# Patient Record
Sex: Female | Born: 1980 | Race: Black or African American | Hispanic: No | Marital: Single | Smoking: Current every day smoker
Health system: Southern US, Community
[De-identification: ages and names within clinical notes are randomized; demographics above are authoritative.]

## PROBLEM LIST (undated history)

## (undated) ENCOUNTER — Inpatient Hospital Stay (HOSPITAL_COMMUNITY): Payer: Self-pay

## (undated) DIAGNOSIS — F1590 Other stimulant use, unspecified, uncomplicated: Secondary | ICD-10-CM

## (undated) DIAGNOSIS — O1490 Unspecified pre-eclampsia, unspecified trimester: Secondary | ICD-10-CM

## (undated) DIAGNOSIS — F3110 Bipolar disorder, current episode manic without psychotic features, unspecified: Secondary | ICD-10-CM

## (undated) DIAGNOSIS — F1220 Cannabis dependence, uncomplicated: Secondary | ICD-10-CM

---
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
## 2001-03-19 ENCOUNTER — Emergency Department (HOSPITAL_COMMUNITY): Admission: EM | Admit: 2001-03-19 | Discharge: 2001-03-19 | Payer: Self-pay | Admitting: Emergency Medicine

## 2001-05-11 ENCOUNTER — Emergency Department (HOSPITAL_COMMUNITY): Admission: EM | Admit: 2001-05-11 | Discharge: 2001-05-11 | Payer: Self-pay | Admitting: Emergency Medicine

## 2002-11-11 ENCOUNTER — Encounter: Admission: RE | Admit: 2002-11-11 | Discharge: 2002-11-11 | Payer: Self-pay | Admitting: Internal Medicine

## 2003-08-23 ENCOUNTER — Emergency Department (HOSPITAL_COMMUNITY): Admission: EM | Admit: 2003-08-23 | Discharge: 2003-08-23 | Payer: Self-pay | Admitting: Emergency Medicine

## 2004-02-07 ENCOUNTER — Inpatient Hospital Stay (HOSPITAL_COMMUNITY): Admission: AD | Admit: 2004-02-07 | Discharge: 2004-02-07 | Payer: Self-pay | Admitting: Obstetrics and Gynecology

## 2004-02-08 ENCOUNTER — Ambulatory Visit: Payer: Self-pay | Admitting: *Deleted

## 2004-02-16 ENCOUNTER — Ambulatory Visit: Payer: Self-pay | Admitting: *Deleted

## 2004-02-16 ENCOUNTER — Ambulatory Visit (HOSPITAL_COMMUNITY): Admission: RE | Admit: 2004-02-16 | Discharge: 2004-02-16 | Payer: Self-pay | Admitting: *Deleted

## 2004-02-23 ENCOUNTER — Ambulatory Visit: Payer: Self-pay | Admitting: *Deleted

## 2004-02-23 ENCOUNTER — Inpatient Hospital Stay (HOSPITAL_COMMUNITY): Admission: AD | Admit: 2004-02-23 | Discharge: 2004-02-23 | Payer: Self-pay | Admitting: Obstetrics and Gynecology

## 2004-03-01 ENCOUNTER — Ambulatory Visit: Payer: Self-pay | Admitting: *Deleted

## 2004-03-09 ENCOUNTER — Inpatient Hospital Stay (HOSPITAL_COMMUNITY): Admission: AD | Admit: 2004-03-09 | Discharge: 2004-03-17 | Payer: Self-pay | Admitting: *Deleted

## 2004-03-09 ENCOUNTER — Ambulatory Visit: Payer: Self-pay | Admitting: Family Medicine

## 2004-03-09 ENCOUNTER — Ambulatory Visit: Payer: Self-pay | Admitting: Obstetrics & Gynecology

## 2004-03-20 ENCOUNTER — Inpatient Hospital Stay (HOSPITAL_COMMUNITY): Admission: AD | Admit: 2004-03-20 | Discharge: 2004-03-20 | Payer: Self-pay | Admitting: Obstetrics and Gynecology

## 2004-03-23 ENCOUNTER — Inpatient Hospital Stay (HOSPITAL_COMMUNITY): Admission: AD | Admit: 2004-03-23 | Discharge: 2004-03-23 | Payer: Self-pay | Admitting: Family Medicine

## 2004-03-25 ENCOUNTER — Emergency Department (HOSPITAL_COMMUNITY): Admission: EM | Admit: 2004-03-25 | Discharge: 2004-03-25 | Payer: Self-pay | Admitting: Emergency Medicine

## 2004-05-09 ENCOUNTER — Emergency Department (HOSPITAL_COMMUNITY): Admission: EM | Admit: 2004-05-09 | Discharge: 2004-05-09 | Payer: Self-pay | Admitting: Emergency Medicine

## 2004-07-21 ENCOUNTER — Emergency Department: Payer: Self-pay | Admitting: Emergency Medicine

## 2005-04-02 ENCOUNTER — Emergency Department: Payer: Self-pay | Admitting: Emergency Medicine

## 2006-01-19 ENCOUNTER — Emergency Department (HOSPITAL_COMMUNITY): Admission: EM | Admit: 2006-01-19 | Discharge: 2006-01-19 | Payer: Self-pay | Admitting: Emergency Medicine

## 2006-04-04 ENCOUNTER — Emergency Department: Payer: Self-pay | Admitting: Emergency Medicine

## 2006-05-30 ENCOUNTER — Emergency Department: Payer: Self-pay | Admitting: Unknown Physician Specialty

## 2006-09-03 ENCOUNTER — Emergency Department: Payer: Self-pay | Admitting: Emergency Medicine

## 2008-01-02 ENCOUNTER — Emergency Department: Payer: Self-pay | Admitting: Emergency Medicine

## 2008-04-09 ENCOUNTER — Emergency Department: Payer: Self-pay | Admitting: Unknown Physician Specialty

## 2008-06-25 ENCOUNTER — Emergency Department: Payer: Self-pay | Admitting: Emergency Medicine

## 2009-01-27 ENCOUNTER — Emergency Department: Payer: Self-pay | Admitting: Unknown Physician Specialty

## 2009-06-01 ENCOUNTER — Inpatient Hospital Stay: Payer: Self-pay | Admitting: Psychiatry

## 2010-02-19 ENCOUNTER — Encounter: Payer: Self-pay | Admitting: Obstetrics & Gynecology

## 2010-02-19 ENCOUNTER — Encounter: Payer: Self-pay | Admitting: *Deleted

## 2011-04-03 ENCOUNTER — Emergency Department: Payer: Self-pay | Admitting: Emergency Medicine

## 2011-04-04 ENCOUNTER — Emergency Department (HOSPITAL_COMMUNITY): Payer: Medicaid Other

## 2011-04-04 ENCOUNTER — Encounter (HOSPITAL_COMMUNITY): Payer: Self-pay | Admitting: Emergency Medicine

## 2011-04-04 ENCOUNTER — Emergency Department (HOSPITAL_COMMUNITY)
Admission: EM | Admit: 2011-04-04 | Discharge: 2011-04-04 | Disposition: A | Discharge status: Home / Self Care | Payer: Medicaid Other | Attending: Emergency Medicine | Admitting: Emergency Medicine

## 2011-04-04 DIAGNOSIS — M7989 Other specified soft tissue disorders: Secondary | ICD-10-CM | POA: Insufficient documentation

## 2011-04-04 DIAGNOSIS — M25569 Pain in unspecified knee: Secondary | ICD-10-CM | POA: Insufficient documentation

## 2011-04-04 DIAGNOSIS — F17200 Nicotine dependence, unspecified, uncomplicated: Secondary | ICD-10-CM | POA: Insufficient documentation

## 2011-04-04 DIAGNOSIS — S8991XA Unspecified injury of right lower leg, initial encounter: Secondary | ICD-10-CM

## 2011-04-04 DIAGNOSIS — S8990XA Unspecified injury of unspecified lower leg, initial encounter: Secondary | ICD-10-CM | POA: Insufficient documentation

## 2011-04-04 DIAGNOSIS — W108XXA Fall (on) (from) other stairs and steps, initial encounter: Secondary | ICD-10-CM | POA: Insufficient documentation

## 2011-04-04 DIAGNOSIS — O99891 Other specified diseases and conditions complicating pregnancy: Secondary | ICD-10-CM | POA: Insufficient documentation

## 2011-04-04 DIAGNOSIS — S99919A Unspecified injury of unspecified ankle, initial encounter: Secondary | ICD-10-CM | POA: Insufficient documentation

## 2011-04-04 HISTORY — DX: Unspecified pre-eclampsia, unspecified trimester: O14.90

## 2011-04-04 NOTE — ED Notes (Signed)
Fell down approximately 8 steps face first. States has right leg pain. Also is [redacted] weeks pregnant hx pre-eclampsia with 1st pregnancy--states face is puffy, eyes puffy. Leg hurts to walk on.

## 2011-04-04 NOTE — Discharge Instructions (Signed)
As we discussed, your x-ray did not show any broken bones. However, x-rays cannot see the ligaments or cartilage in the knee. If your pain continues, it is possible that one of these structures has been damaged and you should see an orthopedic doctor for further evaluation. The doctor on-call today for the ER is listed above. Please use the knee immobilizer and crutches as directed. Use tylenol for pain as needed. Apply ice to the knee several times a day.    Knee Pain The knee is the complex joint between your thigh and your lower leg. It is made up of bones, tendons, ligaments, and cartilage. The bones that make up the knee are:  The femur in the thigh.   The tibia and fibula in the lower leg.   The patella or kneecap riding in the groove on the lower femur.  CAUSES  Knee pain is a common complaint with many causes. A few of these causes are:  Injury, such as:   A ruptured ligament or tendon injury.   Torn cartilage.   Medical conditions, such as:   Gout   Arthritis   Infections   Overuse, over training or overdoing a physical activity.  Knee pain can be minor or severe. Knee pain can accompany debilitating injury. Minor knee problems often respond well to self-care measures or get well on their own. More serious injuries may need medical intervention or even surgery. SYMPTOMS The knee is complex. Symptoms of knee problems can vary widely. Some of the problems are:  Pain with movement and weight bearing.   Swelling and tenderness.   Buckling of the knee.   Inability to straighten or extend your knee.   Your knee locks and you cannot straighten it.   Warmth and redness with pain and fever.   Deformity or dislocation of the kneecap.  DIAGNOSIS  Determining what is wrong may be very straight forward such as when there is an injury. It can also be challenging because of the complexity of the knee. Tests to make a diagnosis may include:  Your caregiver taking a history  and doing a physical exam.   Routine X-rays can be used to rule out other problems. X-rays will not reveal a cartilage tear. Some injuries of the knee can be diagnosed by:   Arthroscopy a surgical technique by which a small video camera is inserted through tiny incisions on the sides of the knee. This procedure is used to examine and repair internal knee joint problems. Tiny instruments can be used during arthroscopy to repair the torn knee cartilage (meniscus).   Arthrography is a radiology technique. A contrast liquid is directly injected into the knee joint. Internal structures of the knee joint then become visible on X-ray film.   An MRI scan is a non x-ray radiology procedure in which magnetic fields and a computer produce two- or three-dimensional images of the inside of the knee. Cartilage tears are often visible using an MRI scanner. MRI scans have largely replaced arthrography in diagnosing cartilage tears of the knee.   Blood work.   Examination of the fluid that helps to lubricate the knee joint (synovial fluid). This is done by taking a sample out using a needle and a syringe.  TREATMENT The treatment of knee problems depends on the cause. Some of these treatments are:  Depending on the injury, proper casting, splinting, surgery or physical therapy care will be needed.   Give yourself adequate recovery time. Do not overuse your joints.  If you begin to get sore during workout routines, back off. Slow down or do fewer repetitions.   For repetitive activities such as cycling or running, maintain your strength and nutrition.   Alternate muscle groups. For example if you are a weight lifter, work the upper body on one day and the lower body the next.   Either tight or weak muscles do not give the proper support for your knee. Tight or weak muscles do not absorb the stress placed on the knee joint. Keep the muscles surrounding the knee strong.   Take care of mechanical problems.    If you have flat feet, orthotics or special shoes may help. See your caregiver if you need help.   Arch supports, sometimes with wedges on the inner or outer aspect of the heel, can help. These can shift pressure away from the side of the knee most bothered by osteoarthritis.   A brace called an "unloader" brace also may be used to help ease the pressure on the most arthritic side of the knee.   If your caregiver has prescribed crutches, braces, wraps or ice, use as directed. The acronym for this is PRICE. This means protection, rest, ice, compression and elevation.   Nonsteroidal anti-inflammatory drugs (NSAID's), can help relieve pain. But if taken immediately after an injury, they may actually increase swelling. Take NSAID's with food in your stomach. Stop them if you develop stomach problems. Do not take these if you have a history of ulcers, stomach pain or bleeding from the bowel. Do not take without your caregiver's approval if you have problems with fluid retention, heart failure, or kidney problems.   For ongoing knee problems, physical therapy may be helpful.   Glucosamine and chondroitin are over-the-counter dietary supplements. Both may help relieve the pain of osteoarthritis in the knee. These medicines are different from the usual anti-inflammatory drugs. Glucosamine may decrease the rate of cartilage destruction.   Injections of a corticosteroid drug into your knee joint may help reduce the symptoms of an arthritis flare-up. They may provide pain relief that lasts a few months. You may have to wait a few months between injections. The injections do have a small increased risk of infection, water retention and elevated blood sugar levels.   Hyaluronic acid injected into damaged joints may ease pain and provide lubrication. These injections may work by reducing inflammation. A series of shots may give relief for as long as 6 months.   Topical painkillers. Applying certain ointments  to your skin may help relieve the pain and stiffness of osteoarthritis. Ask your pharmacist for suggestions. Many over the-counter products are approved for temporary relief of arthritis pain.   In some countries, doctors often prescribe topical NSAID's for relief of chronic conditions such as arthritis and tendinitis. A review of treatment with NSAID creams found that they worked as well as oral medications but without the serious side effects.  PREVENTION  Maintain a healthy weight. Extra pounds put more strain on your joints.   Get strong, stay limber. Weak muscles are a common cause of knee injuries. Stretching is important. Include flexibility exercises in your workouts.   Be smart about exercise. If you have osteoarthritis, chronic knee pain or recurring injuries, you may need to change the way you exercise. This does not mean you have to stop being active. If your knees ache after jogging or playing basketball, consider switching to swimming, water aerobics or other low-impact activities, at least for a few days  a week. Sometimes limiting high-impact activities will provide relief.   Make sure your shoes fit well. Choose footwear that is right for your sport.   Protect your knees. Use the proper gear for knee-sensitive activities. Use kneepads when playing volleyball or laying carpet. Buckle your seat belt every time you drive. Most shattered kneecaps occur in car accidents.   Rest when you are tired.  SEEK MEDICAL CARE IF:  You have knee pain that is continual and does not seem to be getting better.  SEEK IMMEDIATE MEDICAL CARE IF:  Your knee joint feels hot to the touch and you have a high fever. MAKE SURE YOU:   Understand these instructions.   Will watch your condition.   Will get help right away if you are not doing well or get worse.  Document Released: 11/12/2006 Document Revised: 01/04/2011 Document Reviewed: 11/12/2006 [facility] Patient Information 2012 [place], [place].

## 2011-04-04 NOTE — ED Provider Notes (Signed)
History     CSN: [id number]  Arrival date & time 04/04/11  1119   First MD Initiated Contact with Patient 04/04/11 1124      Chief Complaint  Patient presents with  . right leg pain   . fell down steps   . 15 weeks preg     (Consider location/radiation/quality/duration/timing/severity/associated sxs/prior treatment) Patient is a 31 y.o. female presenting with knee pain. The history is provided by the patient.  Knee Pain This is a new (right knee) problem. The current episode started yesterday. The problem occurs constantly. The problem has been gradually worsening. Associated symptoms include joint swelling. Pertinent negatives include no abdominal pain, chills, fever, headaches, numbness or weakness. The symptoms are aggravated by walking. She has tried acetaminophen for the symptoms. The treatment provided mild relief.  Pt slipped and fell while walking down carpeted steps yesterday, knee twisted underneath her. Able to bear weight immediately after injury, but pain and swelling have increased since that time. There was no head injury or LOC. Of note, pt is [redacted] weeks pregnant- denies abd pain or vaginal bleeding.  Past Medical History  Diagnosis Date  . Preeclampsia 7 years ago     with first pregnancy    Past Surgical History  Procedure Date  . Cesarean section 7 years ago    History reviewed. No pertinent family history.  History  Substance Use Topics  . Smoking status: Current Some Day Smoker    Types: Cigarettes  . Smokeless tobacco: Never Used  . Alcohol Use: No    OB History    Grav Para Term Preterm Abortions TAB SAB Ect Mult Living   2 1              Review of Systems  Constitutional: Negative for fever and chills.  Gastrointestinal: Negative for abdominal pain.  Genitourinary: Negative for vaginal bleeding.  Musculoskeletal: Positive for joint swelling and gait problem.       See HPI  Skin: Negative for color change and wound.  Neurological: Negative  for syncope, weakness, numbness and headaches.    Allergies  Review of patient's allergies indicates no known allergies.  Home Medications   Current Outpatient Rx  Name Route Sig Dispense Refill  . ACETAMINOPHEN 500 MG PO TABS Oral Take 1,000 mg by mouth every 6 (six) hours as needed. For pain.    Marland Kitchen PRENATAL MULTIVITAMIN CH Oral Take 1 tablet by mouth daily.      BP 123/76  Pulse 100  Temp 98.9 F (37.2 C)  Resp 18  SpO2 100%  Physical Exam  Nursing note and vitals reviewed. Constitutional: She is oriented to person, place, and time. She appears well-developed and well-nourished. No distress.  HENT:  Head: Normocephalic and atraumatic.  Right Ear: External ear normal.  Left Ear: External ear normal.  Mouth/Throat: Oropharynx is clear and moist.  Eyes: Pupils are equal, round, and reactive to light.  Neck: Normal range of motion. Neck supple.  Cardiovascular: Normal rate, regular rhythm and intact distal pulses.   Pulmonary/Chest: No respiratory distress. She exhibits no tenderness.  Abdominal: Soft. She exhibits no distension. There is no tenderness.  Musculoskeletal:       Right knee: She exhibits swelling. She exhibits no ecchymosis, no deformity, no laceration, no LCL laxity, normal patellar mobility and no MCL laxity. tenderness found. Medial joint line tenderness noted.       Pain with varus stress to right knee. Lachman with firm endpoint. MSK exam otherwise without tenderness  to any joints.  Neurological: She is oriented to person, place, and time.  Skin: Skin is warm and dry.  Psychiatric: She has a normal mood and affect.    ED Course  Procedures (including critical care time)  Labs Reviewed - No data to display Dg Knee Complete 4 Views Right  04/04/2011  *RADIOLOGY REPORT*  Clinical Data: Fall, knee pain.  RIGHT KNEE - COMPLETE 4+ VIEW  Comparison: None  Findings: Mild degenerative changes in the right knee, most seen in the lateral compartment.  No joint  effusion. No acute bony abnormality.  Specifically, no fracture, subluxation, or dislocation.  Soft tissues are intact.  IMPRESSION: Mild degenerative changes. No acute bony abnormality.  Original Report Authenticated By: Cyndie Chime, M.D.     Dx 1: Right knee injury/pain   MDM  12:05 PM Pt seen and evaluated. Mechanical fall yesterday with knee injury. Will x-ray, though exam suspicious for soft tissue injury rather than bony abnormality. Pt pregnant, doubt compromise as no abd pain, no vag bleeding, good fetal heart tones in ED.   1:04 PM X-ray has been reviewed, no acute findings. Discussed possibility of ligament or cartilage damage with pt, who will f.u with orthopedics if no pain improvement in next several days. Knee immobilizer and crutches ordered. She is agreeable with using only tylenol for pain for now and calling her OB if she needs stronger pain medication.        Shaaron Adler, [place] 04/04/11 [phone]

## 2011-04-05 NOTE — ED Provider Notes (Signed)
Medical screening examination/treatment/procedure(s) were conducted as a shared visit with non-physician practitioner(s) and myself.  I personally evaluated the patient during the encounter Pain to knee. Knee stable. No effusion. xrays neg. Discussed diff dx incl st injury, ligamentous injury, meniscal injury w pt, and need for follow up.   Suzi Roots, MD 04/05/11 1538

## 2011-08-02 ENCOUNTER — Other Ambulatory Visit: Payer: Self-pay

## 2011-08-02 ENCOUNTER — Encounter (HOSPITAL_COMMUNITY): Payer: Self-pay | Admitting: *Deleted

## 2011-08-02 ENCOUNTER — Inpatient Hospital Stay (HOSPITAL_COMMUNITY)
Admission: AD | Admit: 2011-08-02 | Discharge: 2011-08-03 | Disposition: A | Discharge status: Home / Self Care | Payer: Medicaid Other | Source: Ambulatory Visit | Attending: Obstetrics & Gynecology | Admitting: Obstetrics & Gynecology

## 2011-08-02 DIAGNOSIS — O2650 Maternal hypotension syndrome, unspecified trimester: Secondary | ICD-10-CM | POA: Insufficient documentation

## 2011-08-02 DIAGNOSIS — O99320 Drug use complicating pregnancy, unspecified trimester: Secondary | ICD-10-CM

## 2011-08-02 DIAGNOSIS — R404 Transient alteration of awareness: Secondary | ICD-10-CM

## 2011-08-02 DIAGNOSIS — R55 Syncope and collapse: Secondary | ICD-10-CM

## 2011-08-02 DIAGNOSIS — O99810 Abnormal glucose complicating pregnancy: Secondary | ICD-10-CM | POA: Insufficient documentation

## 2011-08-02 LAB — GLUCOSE, CAPILLARY: Glucose-Capillary: 74 mg/dL (ref 70–99)

## 2011-08-02 LAB — URINALYSIS, ROUTINE W REFLEX MICROSCOPIC
Bilirubin Urine: NEGATIVE
Ketones, ur: 15 mg/dL — AB
Nitrite: NEGATIVE
Protein, ur: 100 mg/dL — AB
Urobilinogen, UA: 1 mg/dL (ref 0.0–1.0)

## 2011-08-02 LAB — CBC
HCT: 32.2 % — ABNORMAL LOW (ref 36.0–46.0)
MCV: 92.5 fL (ref 78.0–100.0)
Platelets: 296 10*3/uL (ref 150–400)
RBC: 3.48 MIL/uL — ABNORMAL LOW (ref 3.87–5.11)

## 2011-08-02 LAB — URINE MICROSCOPIC-ADD ON

## 2011-08-02 NOTE — MAU Note (Signed)
Pt reports "passing out" at about 2000. Pt felt like her "ears were ringing and everything got blurry" and then she blacked out. Pt states that she fell on her back. Pt denies contractions, but states that she possibly urinated on herself or broke her water when she passed out.

## 2011-08-02 NOTE — MAU Provider Note (Signed)
History   CSN: [id number]  Arrival date and time: 08/02/11 2027   Chief Complaint  Patient presents with  . Loss of Consciousness   HPI: Ms. Gillentine is a 31 year old G3P0111 with diet-controlled gestational diabetes who passed out tonight.  She was at a family gathering and felt hot and like she was going to faint all of a sudden, and then she fell backward onto the floor and hit her shoulders and head on the floor when she passed out.  Her boyfriend reports that her eyes were rolling in the back of her head and that her tongue was poking out of her mouth after she passed out.  People rapidly flocked to her and she woke up immediately and did not seem confused.  She felt anxiety about being in a room with so many people, chest tightness but no palpitations, SOB, or nausea prior to the episode.  She also denies vaginal bleeding and reports good fetal movement.  She denies tongue biting/injury and fecal incontinence during the episode, but did note that her underwear were wet after the episode, and she wonders whether or not her water broke.  It did not continue to leak after that.  She had felt fine throughout the day and has had a good appetite recently, eating and drinking plenty.  She does admit to smoking marijuana today, which she does fairly frequently.  She also smokes cigarettes some.  Right now, she feels "85 to 90% better" and is still a little groggy.  She has passed out several times before, once during this pregnancy and a few times prior to that as well.   Past Medical History  Diagnosis Date  . Preeclampsia 7 years ago     with first pregnancy  No seizures  Past Surgical History  Procedure Date  . Cesarean section 7 years ago    Family History  Problem Relation Age of Onset  . Other Neg Hx   No seizure disorders  History  Substance Use Topics  . Smoking status: Current Some Day Smoker    Types: Cigarettes  . Smokeless tobacco: Never Used  . Alcohol Use: No  Denies  heroin, cocaine, etc. But does endorse marijuana use.  Allergies: No Known Allergies  Prescriptions prior to admission  Medication Sig Dispense Refill  . Prenatal Vit-Fe Fumarate-FA (PRENATAL MULTIVITAMIN) TABS Take 1 tablet by mouth every morning.         Review of Systems  Constitutional: Negative for fever and chills.  HENT: Negative for congestion, neck pain and tinnitus.   Eyes: Negative for blurred vision.  Respiratory: Negative for cough and shortness of breath.   Cardiovascular: Negative for chest pain, palpitations and leg swelling.  Gastrointestinal: Negative for vomiting, abdominal pain and diarrhea.  Genitourinary: Negative for dysuria, urgency and frequency.  Musculoskeletal: Negative for back pain.  Skin: Positive for itching and rash (dry skin).  Neurological: Positive for dizziness and loss of consciousness. Negative for tingling, tremors, focal weakness and headaches (just sore from fall).  Psychiatric/Behavioral: Positive for substance abuse. Negative for depression and hallucinations. The patient is not nervous/anxious.    Physical Exam   Blood pressure 112/75, pulse 101, temperature 98.4 F (36.9 C), temperature source Oral, resp. rate 20.  Orthostatics: negative  Physical Exam  Constitutional: She appears well-developed and well-nourished.  HENT:  Head: Head is with contusion. Head is without abrasion and without laceration.    Right Ear: No hemotympanum.  Left Ear: No hemotympanum.  Mouth/Throat: Oropharynx is clear  and moist.  Eyes: Pupils are equal, round, and reactive to light. Right conjunctiva is injected. No scleral icterus.  Neck: Normal range of motion. Neck supple.  Cardiovascular: Regular rhythm and normal heart sounds.   Respiratory: Effort normal and breath sounds normal. No respiratory distress.  GI: Soft.  Genitourinary: Uterus normal. Cervix exhibits no motion tenderness and no friability. No erythema, tenderness or bleeding around the  vagina. Vaginal discharge (white, milky) found.       No pooling of clear fluid  Neurological: She is alert. She displays normal reflexes. She exhibits normal muscle tone. Coordination and gait normal.  Skin: Skin is warm and dry. She is not diaphoretic.  Psychiatric: Her affect is blunt. Slurred: seems altered.   FHT: Baseline = 135 with moderate baseline variability. Occasional accelerations, no decelerations seen.   Tocometry: No uterine contractions seen.  Ferning: negative  MAU Course  Procedures   BG = 74  Will observe for 4 hours given trauma of fall.  Send UDS and UA, check CBC, EKG.  Assessment and Plan  1. Syncope: Likely a combination of substance use, anxiety, and mild hypovolemia.  She has re-hydrated orally and we have monitored the baby for 4 hours.  Junious Silk S 08/02/2011, 10:19 PM

## 2011-08-03 LAB — RAPID URINE DRUG SCREEN, HOSP PERFORMED
Benzodiazepines: NOT DETECTED
Cocaine: POSITIVE — AB
Opiates: NOT DETECTED
Tetrahydrocannabinol: POSITIVE — AB

## 2011-08-03 LAB — URINE CULTURE
Colony Count: NO GROWTH
Culture: NO GROWTH

## 2011-08-04 NOTE — MAU Provider Note (Signed)
Agree with the above management.

## 2012-04-04 ENCOUNTER — Emergency Department: Payer: Self-pay | Admitting: Emergency Medicine

## 2012-04-04 LAB — CBC WITH DIFFERENTIAL/PLATELET
Basophil %: 0.2 %
Eosinophil #: 0.1 10*3/uL (ref 0.0–0.7)
Lymphocyte #: 2.9 10*3/uL (ref 1.0–3.6)
Lymphocyte %: 19.1 %
Monocyte #: 0.7 x10 3/mm (ref 0.2–0.9)
RBC: 3.79 10*6/uL — ABNORMAL LOW (ref 3.80–5.20)
RDW: 17.3 % — ABNORMAL HIGH (ref 11.5–14.5)
WBC: 15 10*3/uL — ABNORMAL HIGH (ref 3.6–11.0)

## 2012-04-04 LAB — URINALYSIS, COMPLETE
Bilirubin,UR: NEGATIVE
Glucose,UR: 150 mg/dL (ref 0–75)
Ketone: NEGATIVE
Nitrite: NEGATIVE
Ph: 6 (ref 4.5–8.0)
Protein: 30
Squamous Epithelial: 3
WBC UR: 237 /HPF (ref 0–5)

## 2012-04-04 LAB — BASIC METABOLIC PANEL
Anion Gap: 5 — ABNORMAL LOW (ref 7–16)
Co2: 26 mmol/L (ref 21–32)
Creatinine: 0.61 mg/dL (ref 0.60–1.30)
EGFR (Non-African Amer.): >60
Glucose: 109 mg/dL — ABNORMAL HIGH (ref 65–99)
Osmolality: 276 (ref 275–301)
Potassium: 3.3 mmol/L — ABNORMAL LOW (ref 3.5–5.1)
Sodium: 139 mmol/L (ref 136–145)

## 2012-04-04 LAB — CK TOTAL AND CKMB (NOT AT ARMC): CK-MB: 0.8 ng/mL (ref 0.5–3.6)

## 2012-09-24 ENCOUNTER — Ambulatory Visit: Payer: Self-pay | Admitting: Obstetrics and Gynecology

## 2012-09-24 LAB — CBC WITH DIFFERENTIAL/PLATELET
Basophil #: 0 10*3/uL (ref 0.0–0.1)
Basophil %: 0.2 %
Eosinophil #: 0 10*3/uL (ref 0.0–0.7)
Eosinophil %: 0.4 %
HGB: 10.7 g/dL — ABNORMAL LOW (ref 12.0–16.0)
Lymphocyte %: 20.2 %
MCHC: 34.1 g/dL (ref 32.0–36.0)
MCV: 90 fL (ref 80–100)
Monocyte %: 5.7 %
Neutrophil #: 8.1 10*3/uL — ABNORMAL HIGH (ref 1.4–6.5)
Neutrophil %: 73.5 %
RBC: 3.48 10*6/uL — ABNORMAL LOW (ref 3.80–5.20)
RDW: 15.2 % — ABNORMAL HIGH (ref 11.5–14.5)
WBC: 11 10*3/uL (ref 3.6–11.0)

## 2012-09-25 ENCOUNTER — Inpatient Hospital Stay: Payer: Self-pay | Admitting: Obstetrics and Gynecology

## 2012-09-25 LAB — DRUG SCREEN, URINE
Amphetamines, Ur Screen: NEGATIVE (ref ?–1000)
Barbiturates, Ur Screen: NEGATIVE (ref ?–200)
Benzodiazepine, Ur Scrn: NEGATIVE (ref ?–200)
Cannabinoid 50 Ng, Ur ~~LOC~~: POSITIVE (ref ?–50)
MDMA (Ecstasy)Ur Screen: NEGATIVE (ref ?–500)
Methadone, Ur Screen: NEGATIVE (ref ?–300)
Phencyclidine (PCP) Ur S: NEGATIVE (ref ?–25)
Tricyclic, Ur Screen: NEGATIVE (ref ?–1000)

## 2012-09-27 LAB — BASIC METABOLIC PANEL
Anion Gap: 6 — ABNORMAL LOW (ref 7–16)
BUN: 8 mg/dL (ref 7–18)
Co2: 27 mmol/L (ref 21–32)
EGFR (African American): >60
EGFR (Non-African Amer.): >60
Glucose: 89 mg/dL (ref 65–99)
Potassium: 3.5 mmol/L (ref 3.5–5.1)
Sodium: 139 mmol/L (ref 136–145)

## 2012-09-27 LAB — CBC WITH DIFFERENTIAL/PLATELET
Basophil #: 0 10*3/uL (ref 0.0–0.1)
Basophil %: 0.2 %
Eosinophil #: 0.1 10*3/uL (ref 0.0–0.7)
Eosinophil %: 0.3 %
HGB: 8.1 g/dL — ABNORMAL LOW (ref 12.0–16.0)
MCH: 30.8 pg (ref 26.0–34.0)
MCHC: 34.2 g/dL (ref 32.0–36.0)
MCV: 90 fL (ref 80–100)
Monocyte #: 1 x10 3/mm — ABNORMAL HIGH (ref 0.2–0.9)
Neutrophil #: 12 10*3/uL — ABNORMAL HIGH (ref 1.4–6.5)
Neutrophil %: 82.8 %
Platelet: 290 10*3/uL (ref 150–440)
RBC: 2.63 10*6/uL — ABNORMAL LOW (ref 3.80–5.20)

## 2012-09-28 LAB — CBC WITH DIFFERENTIAL/PLATELET
Eosinophil %: 0.9 %
Lymphocyte #: 2.1 10*3/uL (ref 1.0–3.6)
Lymphocyte %: 16.5 %
MCH: 30.3 pg (ref 26.0–34.0)
Neutrophil #: 9.4 10*3/uL — ABNORMAL HIGH (ref 1.4–6.5)
Neutrophil %: 74.8 %
Platelet: 278 10*3/uL (ref 150–440)

## 2012-10-02 LAB — PATHOLOGY REPORT

## 2012-10-02 LAB — WOUND CULTURE

## 2013-04-28 ENCOUNTER — Observation Stay: Payer: Self-pay | Admitting: Surgery

## 2013-04-28 LAB — BASIC METABOLIC PANEL
ANION GAP: 5 — AB (ref 7–16)
BUN: 9 mg/dL (ref 7–18)
CHLORIDE: 104 mmol/L (ref 98–107)
CO2: 30 mmol/L (ref 21–32)
Calcium, Total: 9.1 mg/dL (ref 8.5–10.1)
Creatinine: 0.93 mg/dL (ref 0.60–1.30)
EGFR (Non-African Amer.): >60
Glucose: 94 mg/dL (ref 65–99)
OSMOLALITY: 276 (ref 275–301)
Potassium: 3.3 mmol/L — ABNORMAL LOW (ref 3.5–5.1)
Sodium: 139 mmol/L (ref 136–145)

## 2013-04-28 LAB — CBC WITH DIFFERENTIAL/PLATELET
Basophil #: 0.1 10*3/uL (ref 0.0–0.1)
Basophil %: 0.4 %
EOS PCT: 0.6 %
Eosinophil #: 0.1 10*3/uL (ref 0.0–0.7)
HCT: 36.4 % (ref 35.0–47.0)
HGB: 11.4 g/dL — ABNORMAL LOW (ref 12.0–16.0)
LYMPHS ABS: 3 10*3/uL (ref 1.0–3.6)
Lymphocyte %: 23.2 %
MCH: 27.1 pg (ref 26.0–34.0)
MCHC: 31.4 g/dL — ABNORMAL LOW (ref 32.0–36.0)
MCV: 87 fL (ref 80–100)
Monocyte #: 0.9 x10 3/mm (ref 0.2–0.9)
Monocyte %: 6.8 %
Neutrophil #: 9 10*3/uL — ABNORMAL HIGH (ref 1.4–6.5)
Neutrophil %: 69 %
PLATELETS: 419 10*3/uL (ref 150–440)
RBC: 4.21 10*6/uL (ref 3.80–5.20)
RDW: 16.1 % — ABNORMAL HIGH (ref 11.5–14.5)
WBC: 13.1 10*3/uL — ABNORMAL HIGH (ref 3.6–11.0)

## 2013-04-28 LAB — PREGNANCY, URINE: Pregnancy Test, Urine: NEGATIVE m[IU]/mL

## 2013-05-03 LAB — CULTURE, BLOOD (SINGLE)

## 2013-07-17 ENCOUNTER — Emergency Department (HOSPITAL_COMMUNITY): Payer: Medicaid Other

## 2013-07-17 ENCOUNTER — Emergency Department (HOSPITAL_COMMUNITY)
Admission: EM | Admit: 2013-07-17 | Discharge: 2013-07-17 | Disposition: A | Discharge status: Home / Self Care | Payer: Medicaid Other | Attending: Emergency Medicine | Admitting: Emergency Medicine

## 2013-07-17 ENCOUNTER — Encounter (HOSPITAL_COMMUNITY): Payer: Self-pay | Admitting: Emergency Medicine

## 2013-07-17 DIAGNOSIS — Z79899 Other long term (current) drug therapy: Secondary | ICD-10-CM | POA: Insufficient documentation

## 2013-07-17 DIAGNOSIS — N390 Urinary tract infection, site not specified: Secondary | ICD-10-CM | POA: Insufficient documentation

## 2013-07-17 DIAGNOSIS — Z3202 Encounter for pregnancy test, result negative: Secondary | ICD-10-CM | POA: Insufficient documentation

## 2013-07-17 DIAGNOSIS — F17200 Nicotine dependence, unspecified, uncomplicated: Secondary | ICD-10-CM | POA: Insufficient documentation

## 2013-07-17 DIAGNOSIS — N83201 Unspecified ovarian cyst, right side: Secondary | ICD-10-CM

## 2013-07-17 DIAGNOSIS — N83209 Unspecified ovarian cyst, unspecified side: Secondary | ICD-10-CM | POA: Insufficient documentation

## 2013-07-17 LAB — CBC WITH DIFFERENTIAL/PLATELET
Basophils Absolute: 0 10*3/uL (ref 0.0–0.1)
Basophils Relative: 0 % (ref 0–1)
Eosinophils Absolute: 0.1 10*3/uL (ref 0.0–0.7)
Eosinophils Relative: 1 % (ref 0–5)
HCT: 36.6 % (ref 36.0–46.0)
Hemoglobin: 11.6 g/dL — ABNORMAL LOW (ref 12.0–15.0)
Lymphocytes Relative: 47 % — ABNORMAL HIGH (ref 12–46)
Lymphs Abs: 5.4 10*3/uL — ABNORMAL HIGH (ref 0.7–4.0)
MCH: 27.4 pg (ref 26.0–34.0)
MCHC: 31.7 g/dL (ref 30.0–36.0)
MCV: 86.5 fL (ref 78.0–100.0)
Monocytes Absolute: 1.2 10*3/uL — ABNORMAL HIGH (ref 0.1–1.0)
Monocytes Relative: 10 % (ref 3–12)
NEUTROS ABS: 4.8 10*3/uL (ref 1.7–7.7)
NEUTROS PCT: 42 % — AB (ref 43–77)
PLATELETS: 430 10*3/uL — AB (ref 150–400)
RBC: 4.23 MIL/uL (ref 3.87–5.11)
RDW: 16.3 % — ABNORMAL HIGH (ref 11.5–15.5)
WBC: 11.5 10*3/uL — ABNORMAL HIGH (ref 4.0–10.5)

## 2013-07-17 LAB — TYPE AND SCREEN
ABO/RH(D): B POS
Antibody Screen: NEGATIVE

## 2013-07-17 LAB — COMPREHENSIVE METABOLIC PANEL
ALT: 9 U/L (ref 0–35)
AST: 13 U/L (ref 0–37)
Albumin: 3.4 g/dL — ABNORMAL LOW (ref 3.5–5.2)
Alkaline Phosphatase: 75 U/L (ref 39–117)
BUN: 12 mg/dL (ref 6–23)
CHLORIDE: 102 meq/L (ref 96–112)
CO2: 23 meq/L (ref 19–32)
Calcium: 9.4 mg/dL (ref 8.4–10.5)
Creatinine, Ser: 0.93 mg/dL (ref 0.50–1.10)
GFR, EST NON AFRICAN AMERICAN: 81 mL/min — AB (ref >90)
GLUCOSE: 108 mg/dL — AB (ref 70–99)
Potassium: 4.4 mEq/L (ref 3.7–5.3)
SODIUM: 139 meq/L (ref 137–147)
Total Bilirubin: <0.2 mg/dL — ABNORMAL LOW (ref 0.3–1.2)
Total Protein: 8 g/dL (ref 6.0–8.3)

## 2013-07-17 LAB — URINALYSIS, ROUTINE W REFLEX MICROSCOPIC
Bilirubin Urine: NEGATIVE
GLUCOSE, UA: NEGATIVE mg/dL
Ketones, ur: NEGATIVE mg/dL
Nitrite: POSITIVE — AB
PROTEIN: 30 mg/dL — AB
Specific Gravity, Urine: 1.028 (ref 1.005–1.030)
UROBILINOGEN UA: 1 mg/dL (ref 0.0–1.0)
pH: 6.5 (ref 5.0–8.0)

## 2013-07-17 LAB — PREGNANCY, URINE: Preg Test, Ur: NEGATIVE

## 2013-07-17 LAB — HCG, QUANTITATIVE, PREGNANCY: hCG, Beta Chain, Quant, S: <1 m[IU]/mL (ref <5)

## 2013-07-17 LAB — ABO/RH: ABO/RH(D): B POS

## 2013-07-17 LAB — URINE MICROSCOPIC-ADD ON

## 2013-07-17 LAB — WET PREP, GENITAL
TRICH WET PREP: NONE SEEN
YEAST WET PREP: NONE SEEN

## 2013-07-17 LAB — LIPASE, BLOOD: Lipase: 37 U/L (ref 11–59)

## 2013-07-17 MED ORDER — MORPHINE SULFATE 4 MG/ML IJ SOLN
4.0000 mg | Freq: Once | INTRAMUSCULAR | Status: AC
  Administered 2013-07-17: 4 mg via INTRAVENOUS
  Filled 2013-07-17: qty 1

## 2013-07-17 MED ORDER — FENTANYL CITRATE 0.05 MG/ML IJ SOLN
50.0000 ug | Freq: Once | INTRAMUSCULAR | Status: AC
  Administered 2013-07-17: 50 ug via INTRAVENOUS
  Filled 2013-07-17: qty 2

## 2013-07-17 MED ORDER — OXYCODONE-ACETAMINOPHEN 5-325 MG PO TABS: 1.0000 | ORAL_TABLET | ORAL | Status: DC | PRN

## 2013-07-17 MED ORDER — ONDANSETRON HCL 4 MG/2ML IJ SOLN
4.0000 mg | Freq: Once | INTRAMUSCULAR | Status: AC
  Administered 2013-07-17: 4 mg via INTRAVENOUS
  Filled 2013-07-17: qty 2

## 2013-07-17 MED ORDER — NITROFURANTOIN MONOHYD MACRO 100 MG PO CAPS: 100.0000 mg | ORAL_CAPSULE | Freq: Two times a day (BID) | ORAL | Status: DC

## 2013-07-17 MED ORDER — SODIUM CHLORIDE 0.9 % IV BOLUS (SEPSIS)
1000.0000 mL | Freq: Once | INTRAVENOUS | Status: AC
  Administered 2013-07-17: 1000 mL via INTRAVENOUS

## 2013-07-17 MED ORDER — DIPHENHYDRAMINE HCL 50 MG/ML IJ SOLN
25.0000 mg | Freq: Once | INTRAMUSCULAR | Status: AC
  Administered 2013-07-17: 25 mg via INTRAVENOUS
  Filled 2013-07-17: qty 1

## 2013-07-17 MED ORDER — IOHEXOL 300 MG/ML  SOLN
100.0000 mL | Freq: Once | INTRAMUSCULAR | Status: AC | PRN
  Administered 2013-07-17: 100 mL via INTRAVENOUS

## 2013-07-17 NOTE — ED Provider Notes (Signed)
CSN: [id number]     Arrival date & time 07/17/13  0435 History   First MD Initiated Contact with Patient 07/17/13 [phone]     Chief Complaint  Patient presents with  . Abdominal Pain      Patient is a 33 y.o. female presenting with abdominal pain. The history is provided by the patient.  Abdominal Pain Pain location:  RLQ and LLQ Pain quality: sharp   Pain radiates to:  Does not radiate Pain severity:  Severe Onset quality:  Sudden Duration:  1 hour Timing:  Constant Progression:  Worsening Chronicity:  New Context: awakening from sleep   Relieved by:  Nothing Worsened by:  Movement Associated symptoms: nausea   Associated symptoms: no fever, no vaginal bleeding and no vomiting    Pt reports she woke up with severe abdominal pain She has never had this before She had otherwise been well prior to this episode  Past Medical History  Diagnosis Date  . Preeclampsia 7 years ago     with first pregnancy   Past Surgical History  Procedure Laterality Date  . Cesarean section  7 years ago   Family History  Problem Relation Age of Onset  . Other Neg Hx    History  Substance Use Topics  . Smoking status: Current Some Day Smoker    Types: Cigarettes  . Smokeless tobacco: Never Used  . Alcohol Use: No   OB History   Grav Para Term Preterm Abortions TAB SAB Ect Mult Living   3 1  1 1 1    1      Review of Systems  Constitutional: Negative for fever.  Gastrointestinal: Positive for nausea and abdominal pain. Negative for vomiting.  Genitourinary: Negative for vaginal bleeding.  All other systems reviewed and are negative.     Allergies  Review of patient's allergies indicates no known allergies.  Home Medications   Prior to Admission medications   Medication Sig Start Date End Date Taking? Authorizing Len Kluver  Prenatal Vit-Fe Fumarate-FA (PRENATAL MULTIVITAMIN) TABS Take 1 tablet by mouth every morning.     Historical Evalyne Cortopassi, MD   BP 115/69  Pulse 77  SpO2  96%  LMP 06/29/2013  Breastfeeding? Unknown Physical Exam CONSTITUTIONAL: Well developed/well nourished, anxious HEAD: Normocephalic/atraumatic EYES: EOMI/PERRL, no icterus ENMT: Mucous membranes moist NECK: supple no meningeal signs SPINE:entire spine nontender CV: S1/S2 noted, no murmurs/rubs/gallops noted LUNGS: Lungs are clear to auscultation bilaterally, no apparent distress ABDOMEN: soft, significant tenderness in LLQ, moderate tenderness in RLQ, no rebound or guarding GU:no cva tenderness No cmt.  She has left adnexal tenderness and uterine tenderness.  No right adnexal tenderness. White discharge noted.  No vag bleeding Female chaperone present. NEURO: Pt is awake/alert, moves all extremitiesx4 EXTREMITIES: pulses normal, full ROM SKIN: warm, color normal PSYCH: anxious  ED Course  Procedures  4:56 AM Pt with sudden onset of lower abdominal pain She is ill appearing and will likely require advanced imaging Will follow closely 6:11 AM Pt is not pregnant She now has significant tenderness in RLQ Given changing location of pain will obtain CT imaging I doubt ovarian torsion given changing location of pain  She has UTI but this would not explain her pain 7:45 AM At signout to Dr Criss AlvineGoldston, plan is to f/u on CT imaging and then reassess.  Pt currently stable  Labs Review Labs Reviewed  WET PREP, GENITAL - Abnormal; Notable for the following:    Clue Cells Wet Prep HPF POC FEW (*)  WBC, Wet Prep HPF POC FEW (*)    All other components within normal limits  CBC WITH DIFFERENTIAL - Abnormal; Notable for the following:    WBC 11.5 (*)    Hemoglobin 11.6 (*)    RDW 16.3 (*)    Platelets 430 (*)    Neutrophils Relative % 42 (*)    Lymphocytes Relative 47 (*)    Lymphs Abs 5.4 (*)    Monocytes Absolute 1.2 (*)    All other components within normal limits  COMPREHENSIVE METABOLIC PANEL - Abnormal; Notable for the following:    Glucose, Bld 108 (*)    Albumin 3.4 (*)     Total Bilirubin <0.2 (*)    GFR calc non Af Amer 81 (*)    All other components within normal limits  URINALYSIS, ROUTINE W REFLEX MICROSCOPIC - Abnormal; Notable for the following:    APPearance TURBID (*)    Hgb urine dipstick MODERATE (*)    Protein, ur 30 (*)    Nitrite POSITIVE (*)    Leukocytes, UA LARGE (*)    All other components within normal limits  URINE MICROSCOPIC-ADD ON - Abnormal; Notable for the following:    Bacteria, UA FEW (*)    All other components within normal limits  GC/CHLAMYDIA PROBE AMP  LIPASE, BLOOD  HCG, QUANTITATIVE, PREGNANCY  PREGNANCY, URINE  POC URINE PREG, ED  TYPE AND SCREEN  ABO/RH    Imaging Review No results found.   MDM   Final diagnoses:  UTI (lower urinary tract infection)    Nursing notes including past medical history and social history reviewed and considered in documentation Labs/vital reviewed and considered     Joya Gaskinsonald W Wickline, MD 07/17/13 [phone]

## 2013-07-17 NOTE — ED Notes (Addendum)
Pt leaving at 1105 30 mins after receiving morphine IV.

## 2013-07-17 NOTE — ED Notes (Signed)
MD at bedside. 

## 2013-07-17 NOTE — ED Provider Notes (Addendum)
8:39 AM Patient feels better, rates pain as 4 but feels like she needs sleep at this time. CT shows fluid collection in vagina, pregnancy test negative. Also cyst on right adnexa. Given the amount of pain she was in, will further evaluate with TV U/S  10:46 AM Patient's pain has improved. U/S shows right ovarian cyst, likely hemorrhagic. Will refer to Gyn for follow up. Will treat UTI. Is no longer breastfeeding at this time.   Transvaginal Ultrasound  IMPRESSION: 1. 3.1 x 2.3 x 2.6 cm complex lesion right ovary, most likely hemorrhagic cyst . Short-interval follow up ultrasound in 6-12 weeks is recommended, preferably during the week following the patient's normal menses. 2. No evidence of ovarian torsion. 3. Fluid in the vagina previously identified by CT of 07/17/2013 no longer visualized. The fluid may have passed. Again correlation with pregnancy status to exclude aborting pregnancy suggested.   Electronically Signed By: Maisie Fushomas Register On: 07/17/2013 10:25         Audree CamelScott T Goldston, MD 07/17/13 1046  Audree CamelScott T Goldston, MD 07/17/13 1050

## 2013-07-17 NOTE — ED Notes (Signed)
Pt states she awoke this morning with severe abd pain.  Pt currently lying on bed crying

## 2013-07-18 LAB — GC/CHLAMYDIA PROBE AMP
CT PROBE, AMP APTIMA: NEGATIVE
GC Probe RNA: NEGATIVE

## 2013-11-30 ENCOUNTER — Encounter (HOSPITAL_COMMUNITY): Payer: Self-pay | Admitting: Emergency Medicine

## 2014-05-21 NOTE — Op Note (Signed)
PATIENT NAME:  Ellen Morrison, Ellen Morrison DATE OF BIRTH:  [birth year]  DATE OF PROCEDURE:  09/25/2012  PREOPERATIVE DIAGNOSIS: Repeat cesarean section, multiparous female desiring permanent sterilization.   POSTOPERATIVE DIAGNOSIS: Repeat cesarean section, multiparous female desiring permanent sterilization.   PROCEDURE:  Tertiary LUT cesarean section with postpartum bilateral tubal ligation.  SURGEON: Elliot Gurneyarrie C. Eleri Ruben, MD  ASSISTANT: Dr. Dell PontoWeaver-Lee  ESTIMATED BLOOD LOSS: 1000 mL.   FINDINGS: Term liveborn female infant with normal uterus, tubes and ovaries with severe scarring of the anterior abdominal wall with "concrete-like scar tissue" to cut through, in the subcutaneous fat as well as the fascia and musculature.   DESCRIPTION OF PROCEDURE: The patient was taken to the operating room and placed in the supine position. After adequate spinal anesthesia was instilled, the patient was prepped and draped in the usual sterile fashion. Pfannenstiel skin incision was made through the patient's previous skin incision, carried sharply down to the fascia. Fascia was nicked in the midline and an the incision was extended with the [facility] and the curved Mayos very slowly as the tissue was so concrete thick and scarred it was difficult to cut.  An opening wide enough for the baby was successfully made. The muscle bellies were identified, but they were very scarred also and the midline was very difficult to identify, but what was thought to be the midline was found and cut with a scalpel and then extended with the Metzenbaum scissors. It was noted at this time that the omentum was stuck to the anterior abdominal wall. The uterus was scarred from the lower uterine segment to the right side and was scarred into the previous incision. This was sharply and bluntly dissected off to detach the uterus from the anterior abdominal wall. There was no bladder flap to be created as the tissue was all scar. The uterine  incision was made and carried sharply down to the amniotic sac. The incision was then extended with the surgeon's fingers. Amniotic fluid was ruptured. Clear fluid was noted. The infant's head was delivered as was the remainder of the body. The infant was bulb suctioned, cord was clamped, infant was off to the awaiting pediatrician, cord blood was obtained, Pitocin was started and the placenta was delivered. The uterus was then delivered and wrapped in moist laparotomy sponge. The interior of the uterus was curetted with a moist lap sponge. The uterine incision was closed with a running locked chromic suture and then a running embrocating suture. Since there was no bladder to tack back up, this was not performed. The belly was cleared of clots and the 2 tubes were grasped with Babcock clamps. The Bovie was used to enter the broad ligaments. Two pieces of plain gut suture were then used to tie an approximately 2 cm portion of tube. This was then excised with the Metzenbaum scissors. Tubal telescoping was seen bilaterally. Good hemostasis was identified. The uterus was then very gently placed back into the abdomen and the muscle bellies were approximated with a running Vicryl suture. The On-Q trocars were then placed, catheters were threaded, trocars were removed and catheters were wound around the top of the muscle bellies. The fascia was closed with a running Vicryl suture. Subcutaneous fat was attempted to be closed with a plain gut suture and scarring was released with the Bovie. Skin clips were placed on the incision and bandage was placed, 4 x 4 was placed over the ports of the On-Q catheters after the Dermabond had dried.  Steri-Strips were placed to hold this in place. The Tegaderm was then placed and 4 x 4's and ABD was placed on the lower incision with a pressure bandage. The uterus was massaged. Clots were excised. Clear urine was noted in the Foley bag, and the patient was taken to recovery after having  tolerated the procedure well.  ____________________________ Elliot Gurney, MD cck:sb D: 10/03/2012 08:16:00 ET T: 10/03/2012 08:48:14 ET JOB#: [id number]  cc: Elliot Gurney, MD, <Dictator> Elliot Gurney MD ELECTRONICALLY SIGNED 10/10/2012 10:06

## 2014-05-21 NOTE — Op Note (Signed)
PATIENT NAME:  Ellen SleeperMCGEE, [name] MR#:  [id number] DATE OF BIRTH:  1980/08/23  DATE OF PROCEDURE:  09/28/2012  PREOPERATIVE DIAGNOSIS: Left axillary abscess.   POSTOPERATIVE DIAGNOSIS: Left axillary abscess.   PROCEDURE PERFORMED: Incision and drainage.   SURGEON: Quentin Orealph L. Ely, MD  ANESTHESIA: General.  OPERATIVE PROCEDURE: With the patient in the supine position after induction of appropriate general anesthesia, the patient's arms were appropriately positioned and her breast taped to the bed's right side. The axillary area was prepped with Betadine and draped in sterile towels. The soft area in the upper arm was opened and a large amount of creamy pus was removed. The previously draining site was reopened and there appeared to be a large amount of subcutaneous infection in that area, in addition. A counter incision was made higher on the chest wall. The 3 incisions were then jointed and Penrose drains placed through all of them. The drains were secured with 4-0 nylon. The area was irrigated for control of any infection. Sterile dressings were applied. The patient was returned to the recovery room having tolerated the procedure well. Sponge, instrument and needle counts were correct x 2 in the operating room. ____________________________ Quentin Orealph L. Ely III, MD rle:sb D: 09/28/2012 21:36:43 ET T: 09/29/2012 10:25:07 ET JOB#: [id number]  cc: Carmie Endalph L. Ely III, MD, <Dictator> Elliot Gurneyarrie C. Klett, MD Quentin OreALPH L ELY MD ELECTRONICALLY SIGNED 10/13/2012 19:19

## 2014-05-22 NOTE — H&P (Signed)
PATIENT NAME:  Ellen Morrison, Ellen Morrison MR#:  [id number] DATE OF BIRTH:  [birth year]  DATE OF ADMISSION:  04/28/2013  CHIEF COMPLAINT: Left axillary abscess.   HISTORY OF PRESENT ILLNESS: This is a patient who has had hidradenitis who presents with 2 weeks of left axillary pain, which has worsened over the last 3 days. She has had a prior I and D in the past in a similar area of the left axilla 7 months ago.   She denies fevers or chills. Has not had any drainage from this abscess.   PAST MEDICAL HISTORY: The patient denies any medical history. States that she does not take any medications; however, in reviewing her medical reconciliation sheet, there are mental issues and anemia, and also the presence of Bactrim on her medication list, which she did not tell me about.   PAST SURGICAL HISTORY: Cesarean section and prior left axillary I and D.   ALLERGIES: None.   MEDICATIONS: The patient states she takes no medications but her reconciliation sheet names multiples, see reconciliation.   None of those medications were started at this time because she is n.p.o.   FAMILY HISTORY: Noncontributory.   SOCIAL HISTORY: The patient does not smoke or drink. 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   REVIEW OF SYSTEMS: A 10-system review is performed and negative with the exception of that mentioned in the HPI. Also note that the patient ate dinner, a full meal 8:30 p.m. prior to coming to the emergency room.   PHYSICAL EXAMINATION:  GENERAL: Morbidly obese female patient with a BMI of 37.  VITAL SIGNS: Temperature of 97.6, pulse 93, respirations 20, blood pressure 135/96. Pain scale of 8, 99% room air sat.  HEENT: No scleral icterus.  NECK: No palpable neck nodes.  CHEST: Clear to auscultation.  CARDIAC: Regular rate and rhythm.  ABDOMEN: Soft, nontender.  EXTREMITIES: Without edema.  NEUROLOGIC: Grossly intact.  INTEGUMENT: Shows no jaundice in the left axilla. There is a partially healed incision and drainage site, and  posterior to that is a rather large erythematous, fluctuant and very tender abscess. The patient will not let me touch it barely.   LABORATORY RESULTS: Demonstrate a white blood cell count of 13, an H and H of 11 and 36 and a platelet count of 419,000. Electrolytes are within normal limits with the exception of slightly depressed serum potassium of 3.3.   ASSESSMENT AND PLAN: This is a patient with a left axillary abscess. She has had prior I and Ds performed once in the same area but presents now with 2 weeks of pain worsening over the last 2 days to suggest an abscess and this confirmed on physical exam. I have recommended incision and drainage. The rationale for this has been discussed. She ate dinner at 8:30 p.m. therefore, will be scheduled for in the morning and given IV antibiotics and pain medication. The procedure itself was discussed with her and Dr. Egbert GaribaldiBird will be performing the procedure. She was notified of this as was her family.    ____________________________ Adah Salvageichard Morrison. Excell Seltzerooper, MD rec:lt D: 04/28/2013 01:39:48 ET T: 04/28/2013 04:50:11 ET JOB#: [id number]  cc: Adah Salvageichard Morrison. Excell Seltzerooper, MD, <Dictator> Lattie HawICHARD Morrison Marvetta Vohs MD ELECTRONICALLY SIGNED 04/28/2013 6:44

## 2014-05-22 NOTE — H&P (Signed)
   Subjective/Chief Complaint left ax abscess   History of Present Illness two weeks of pain, swelling no f/c prior I&D same area 7 mos ago   Past History PMH none PSH C section, ax abscess drainage   Past Med/Surgical Hx:  mood disorder:   psychosis:   PTSD:   depression:   hypertension:   I&D: L Axilla  C-Section:   ALLERGIES:  No Known Allergies:   Family and Social History:  Family History Non-Contributory   Social History negative tobacco, negative ETOH, waitress   Place of Living Home   Review of Systems:  Fever/Chills No   Cough No   Abdominal Pain No   Diarrhea No   Constipation No   Nausea/Vomiting No   SOB/DOE No   Chest Pain No   Dysuria No   Tolerating Diet Yes  ate dinner at 2030   Physical Exam:  GEN obese, uncomfortable   HEENT pink conjunctivae   NECK supple   RESP normal resp effort  clear BS   CARD regular rate   ABD denies tenderness  soft   LYMPH negative neck   EXTR negative edema   SKIN normal to palpation, left axillary abscess, tender, erythematous   PSYCH alert   Lab Results: Routine Chem:  31-Mar-15 23:49   Glucose, Serum 94  BUN 9  Creatinine (comp) 0.93  Sodium, Serum 139  Potassium, Serum  3.3  Chloride, Serum 104  CO2, Serum 30  Calcium (Total), Serum 9.1  Anion Gap  5  Osmolality (calc) 276  eGFR (African American) >60  eGFR (Non-African American) >60 (eGFR values <63m/min/1.73 m2 may be an indication of chronic kidney disease (CKD). Calculated eGFR is useful in patients with stable renal function. The eGFR calculation will not be reliable in acutely ill patients when serum creatinine is changing rapidly. It is not useful in  patients on dialysis. The eGFR calculation may not be applicable to patients at the low and high extremes of body sizes, pregnant women, and vegetarians.)  Routine Hem:  31-Mar-15 23:49   WBC (CBC)  13.1  RBC (CBC) 4.21  Hemoglobin (CBC)  11.4  Hematocrit (CBC) 36.4   Platelet Count (CBC) 419  MCV 87  MCH 27.1  MCHC  31.4  RDW  16.1  Neutrophil % 69.0  Lymphocyte % 23.2  Monocyte % 6.8  Eosinophil % 0.6  Basophil % 0.4  Neutrophil #  9.0  Lymphocyte # 3.0  Monocyte # 0.9  Eosinophil # 0.1  Basophil # 0.1 (Result(s) reported on 28 Apr 2013 at 12:23AM.)    Assessment/Admission Diagnosis left ax abscess, hidradenitis plan I&D pt ate full meal at 2030 procedure described, Dr BMarina Gravelwill see in am. Start abx   Electronic Signatures: CFlorene Glen(MD)  (Signed 31-Mar-15 01:33)  Authored: CHIEF COMPLAINT and HISTORY, PAST MEDICAL/SURGIAL HISTORY, ALLERGIES, FAMILY AND SOCIAL HISTORY, REVIEW OF SYSTEMS, PHYSICAL EXAM, LABS, ASSESSMENT AND PLAN   Last Updated: 31-Mar-15 01:33 by CFlorene Glen(MD)

## 2014-05-22 NOTE — Op Note (Signed)
PATIENT NAME:  Ellen Morrison, Ellen Morrison MR#:  [id number] DATE OF BIRTH:  August 27, 1980  DATE OF PROCEDURE:  04/28/2013  PREOPERATIVE DIAGNOSIS: Left axillary abscess and hidradenitis.  POSTOPERATIVE DIAGNOSIS: Left axillary abscess and hidradenitis.   PROCEDURE PERFORMED: Incision and drainage of left axillary abscess, placement of Penrose drain x 2.   SURGEON: Natale LayMark Shenandoah Yeats, M.D.   ASSISTANT: None.   ANESTHESIA: General.   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   SPECIMENS: None.   ESTIMATED BLOOD LOSS: Minimal.   DESCRIPTION OF PROCEDURE: With supine position and informed consent, the patient's left arm was abducted, sterilely prepped and draped with Betadine. Timeout was observed. The existing small sinus tract was probed with a Kelly clamp and seemed to track superiorly. Counterincisions were fashioned. Penrose drains were placed. The wound was then packed with 2 pieces of the short iodoform gauze, 0.5 inches. Sterile dressings were applied. The patient was then subsequently taken to the recovery room in stable and satisfactory condition by anesthesia.    ____________________________ Redge GainerMark A. Egbert GaribaldiBird, MD mab:ce D: 04/28/2013 17:39:54 ET T: 04/28/2013 20:25:41 ET JOB#: [id number]  cc: Loraine LericheMark A. Egbert GaribaldiBird, MD, <Dictator> Loyal Rudy A Breeonna Mone MD ELECTRONICALLY SIGNED 05/05/2013 13:11

## 2014-06-07 ENCOUNTER — Encounter: Payer: Self-pay | Admitting: Emergency Medicine

## 2014-06-07 DIAGNOSIS — F312 Bipolar disorder, current episode manic severe with psychotic features: Secondary | ICD-10-CM | POA: Diagnosis not present

## 2014-06-07 DIAGNOSIS — Z720 Tobacco use: Secondary | ICD-10-CM | POA: Insufficient documentation

## 2014-06-07 DIAGNOSIS — F99 Mental disorder, not otherwise specified: Secondary | ICD-10-CM | POA: Diagnosis present

## 2014-06-07 NOTE — ED Notes (Signed)
Patient ambulatory to triage with steady gait, without difficulty or distress noted; pt reports hearing voices with hx manic-bipolar; st off meds for months; st thoughts of hurting self

## 2014-06-08 ENCOUNTER — Encounter: Payer: Self-pay | Admitting: *Deleted

## 2014-06-08 ENCOUNTER — Inpatient Hospital Stay
Admission: RE | Admit: 2014-06-08 | Discharge: 2014-06-11 | DRG: 885 | Disposition: A | Discharge status: Home / Self Care | Payer: Medicaid Other | Source: Intra-hospital | Attending: Psychiatry | Admitting: Psychiatry

## 2014-06-08 ENCOUNTER — Emergency Department
Admission: EM | Admit: 2014-06-08 | Discharge: 2014-06-08 | Disposition: A | Discharge status: Home / Self Care | Payer: Medicaid Other | Attending: Emergency Medicine | Admitting: Emergency Medicine

## 2014-06-08 DIAGNOSIS — Z91128 Patient's intentional underdosing of medication regimen for other reason: Secondary | ICD-10-CM | POA: Diagnosis present

## 2014-06-08 DIAGNOSIS — Z818 Family history of other mental and behavioral disorders: Secondary | ICD-10-CM

## 2014-06-08 DIAGNOSIS — F332 Major depressive disorder, recurrent severe without psychotic features: Secondary | ICD-10-CM | POA: Diagnosis present

## 2014-06-08 DIAGNOSIS — Z9114 Patient's other noncompliance with medication regimen: Secondary | ICD-10-CM | POA: Diagnosis present

## 2014-06-08 DIAGNOSIS — F6089 Other specific personality disorders: Secondary | ICD-10-CM | POA: Diagnosis present

## 2014-06-08 DIAGNOSIS — I10 Essential (primary) hypertension: Secondary | ICD-10-CM | POA: Diagnosis present

## 2014-06-08 DIAGNOSIS — G4700 Insomnia, unspecified: Secondary | ICD-10-CM | POA: Diagnosis present

## 2014-06-08 DIAGNOSIS — F319 Bipolar disorder, unspecified: Secondary | ICD-10-CM | POA: Diagnosis present

## 2014-06-08 DIAGNOSIS — F312 Bipolar disorder, current episode manic severe with psychotic features: Secondary | ICD-10-CM

## 2014-06-08 DIAGNOSIS — F1220 Cannabis dependence, uncomplicated: Secondary | ICD-10-CM | POA: Diagnosis present

## 2014-06-08 DIAGNOSIS — Z915 Personal history of self-harm: Secondary | ICD-10-CM

## 2014-06-08 DIAGNOSIS — F1290 Cannabis use, unspecified, uncomplicated: Secondary | ICD-10-CM | POA: Diagnosis present

## 2014-06-08 DIAGNOSIS — F29 Unspecified psychosis not due to a substance or known physiological condition: Secondary | ICD-10-CM | POA: Diagnosis present

## 2014-06-08 DIAGNOSIS — F17200 Nicotine dependence, unspecified, uncomplicated: Secondary | ICD-10-CM | POA: Diagnosis present

## 2014-06-08 DIAGNOSIS — F17210 Nicotine dependence, cigarettes, uncomplicated: Secondary | ICD-10-CM | POA: Diagnosis present

## 2014-06-08 DIAGNOSIS — N390 Urinary tract infection, site not specified: Secondary | ICD-10-CM | POA: Diagnosis present

## 2014-06-08 DIAGNOSIS — Z9119 Patient's noncompliance with other medical treatment and regimen: Secondary | ICD-10-CM | POA: Diagnosis present

## 2014-06-08 DIAGNOSIS — F1590 Other stimulant use, unspecified, uncomplicated: Secondary | ICD-10-CM | POA: Diagnosis present

## 2014-06-08 DIAGNOSIS — F4310 Post-traumatic stress disorder, unspecified: Secondary | ICD-10-CM | POA: Diagnosis present

## 2014-06-08 HISTORY — DX: Bipolar disorder, current episode manic without psychotic features, unspecified: F31.10

## 2014-06-08 LAB — COMPREHENSIVE METABOLIC PANEL
ALK PHOS: 60 U/L (ref 38–126)
ALT: 11 U/L — AB (ref 14–54)
AST: 16 U/L (ref 15–41)
Albumin: 3.7 g/dL (ref 3.5–5.0)
Anion gap: 7 (ref 5–15)
BUN: 14 mg/dL (ref 6–20)
CALCIUM: 8.9 mg/dL (ref 8.9–10.3)
CO2: 27 mmol/L (ref 22–32)
Chloride: 105 mmol/L (ref 101–111)
Creatinine, Ser: 0.94 mg/dL (ref 0.44–1.00)
GFR calc non Af Amer: >60 mL/min (ref >60)
GLUCOSE: 103 mg/dL — AB (ref 65–99)
POTASSIUM: 3.9 mmol/L (ref 3.5–5.1)
SODIUM: 139 mmol/L (ref 135–145)
Total Bilirubin: 0.2 mg/dL — ABNORMAL LOW (ref 0.3–1.2)
Total Protein: 6.9 g/dL (ref 6.5–8.1)

## 2014-06-08 LAB — URINALYSIS COMPLETE WITH MICROSCOPIC (ARMC ONLY)
Bacteria, UA: NONE SEEN
Bilirubin Urine: NEGATIVE
GLUCOSE, UA: NEGATIVE mg/dL
KETONES UR: NEGATIVE mg/dL
Nitrite: NEGATIVE
Protein, ur: 30 mg/dL — AB
SPECIFIC GRAVITY, URINE: 1.018 (ref 1.005–1.030)
pH: 8 (ref 5.0–8.0)

## 2014-06-08 LAB — CBC
HEMATOCRIT: 35.2 % (ref 35.0–47.0)
Hemoglobin: 11.4 g/dL — ABNORMAL LOW (ref 12.0–16.0)
MCH: 28.3 pg (ref 26.0–34.0)
MCHC: 32.3 g/dL (ref 32.0–36.0)
MCV: 87.6 fL (ref 80.0–100.0)
PLATELETS: 306 10*3/uL (ref 150–440)
RBC: 4.02 MIL/uL (ref 3.80–5.20)
RDW: 15.9 % — ABNORMAL HIGH (ref 11.5–14.5)
WBC: 8.7 10*3/uL (ref 3.6–11.0)

## 2014-06-08 LAB — SALICYLATE LEVEL: Salicylate Lvl: <4 mg/dL (ref 2.8–30.0)

## 2014-06-08 LAB — URINE DRUG SCREEN, QUALITATIVE (ARMC ONLY)
AMPHETAMINES, UR SCREEN: NOT DETECTED
Barbiturates, Ur Screen: NOT DETECTED
Benzodiazepine, Ur Scrn: NOT DETECTED
Cannabinoid 50 Ng, Ur ~~LOC~~: POSITIVE — AB
Cocaine Metabolite,Ur ~~LOC~~: POSITIVE — AB
MDMA (ECSTASY) UR SCREEN: NOT DETECTED
METHADONE SCREEN, URINE: NOT DETECTED
Opiate, Ur Screen: NOT DETECTED
Phencyclidine (PCP) Ur S: NOT DETECTED
TRICYCLIC, UR SCREEN: NOT DETECTED

## 2014-06-08 LAB — ETHANOL: Alcohol, Ethyl (B): <5 mg/dL (ref <5)

## 2014-06-08 LAB — ACETAMINOPHEN LEVEL: Acetaminophen (Tylenol), Serum: <10 ug/mL — ABNORMAL LOW (ref 10–30)

## 2014-06-08 MED ORDER — OLANZAPINE 10 MG PO TABS
5.0000 mg | ORAL_TABLET | Freq: Every day | ORAL | Status: DC
  Administered 2014-06-08 (×2): 5 mg via ORAL
  Filled 2014-06-08 (×2): qty 1

## 2014-06-08 MED ORDER — LORAZEPAM 2 MG PO TABS
2.0000 mg | ORAL_TABLET | Freq: Once | ORAL | Status: AC
  Administered 2014-06-08: 2 mg via ORAL

## 2014-06-08 MED ORDER — CEPHALEXIN 500 MG PO CAPS
ORAL_CAPSULE | ORAL | Status: AC
  Administered 2014-06-08: 500 mg via ORAL
  Filled 2014-06-08: qty 1

## 2014-06-08 MED ORDER — LORAZEPAM 0.5 MG PO TABS
ORAL_TABLET | ORAL | Status: AC
  Administered 2014-06-08: 0.5 mg via ORAL
  Filled 2014-06-08: qty 1

## 2014-06-08 MED ORDER — MAGNESIUM HYDROXIDE 400 MG/5ML PO SUSP: 30.0000 mL | Freq: Every day | ORAL | Status: DC | PRN

## 2014-06-08 MED ORDER — ACETAMINOPHEN 325 MG PO TABS: 650.0000 mg | ORAL_TABLET | Freq: Four times a day (QID) | ORAL | Status: DC | PRN

## 2014-06-08 MED ORDER — LORAZEPAM 0.5 MG PO TABS
0.5000 mg | ORAL_TABLET | Freq: Once | ORAL | Status: AC
  Administered 2014-06-08: 0.5 mg via ORAL

## 2014-06-08 MED ORDER — LURASIDONE HCL 40 MG PO TABS
40.0000 mg | ORAL_TABLET | Freq: Every day | ORAL | Status: DC
  Administered 2014-06-09: 40 mg via ORAL
  Filled 2014-06-08: qty 1

## 2014-06-08 MED ORDER — NICOTINE 14 MG/24HR TD PT24
MEDICATED_PATCH | TRANSDERMAL | Status: AC
  Administered 2014-06-08: 14 mg via TRANSDERMAL
  Filled 2014-06-08: qty 1

## 2014-06-08 MED ORDER — CEPHALEXIN 500 MG PO CAPS
500.0000 mg | ORAL_CAPSULE | Freq: Three times a day (TID) | ORAL | Status: DC
  Administered 2014-06-09 – 2014-06-11 (×7): 500 mg via ORAL
  Filled 2014-06-08 (×11): qty 1

## 2014-06-08 MED ORDER — OLANZAPINE 5 MG PO TABS: 5.0000 mg | ORAL_TABLET | Freq: Every day | ORAL | Status: DC

## 2014-06-08 MED ORDER — ALUM & MAG HYDROXIDE-SIMETH 200-200-20 MG/5ML PO SUSP: 30.0000 mL | ORAL | Status: DC | PRN

## 2014-06-08 MED ORDER — LORAZEPAM 2 MG PO TABS
ORAL_TABLET | ORAL | Status: AC
  Administered 2014-06-08: 2 mg via ORAL
  Filled 2014-06-08: qty 1

## 2014-06-08 MED ORDER — CEPHALEXIN 500 MG PO CAPS
500.0000 mg | ORAL_CAPSULE | Freq: Three times a day (TID) | ORAL | Status: DC
  Administered 2014-06-08 (×3): 500 mg via ORAL
  Filled 2014-06-08 (×5): qty 1

## 2014-06-08 MED ORDER — NICOTINE 14 MG/24HR TD PT24
14.0000 mg | MEDICATED_PATCH | Freq: Every day | TRANSDERMAL | Status: DC
  Administered 2014-06-08: 14 mg via TRANSDERMAL

## 2014-06-08 NOTE — ED Notes (Signed)
Patient is resting comfortably. Refused to eat breakfast.  Respirations even and non-labored at this time.  Patient visualized from the nurses station.

## 2014-06-08 NOTE — ED Notes (Signed)
Pt to ED BHU 1 Patient assigned to appropriate care area. Patient oriented to unit/care area: Informed that, for their safety, care areas are designed for safety and monitored by security cameras at all times; and visiting hours explained to patient. Patient verbalizes understanding, and verbal contract for safety obtained.  [facility] pending bed and staff availability

## 2014-06-08 NOTE — ED Notes (Signed)
BEHAVIORAL HEALTH ROUNDING Patient sleeping: No. Patient alert and oriented: yes Behavior appropriate: Yes.  ; If no, describe:  Nutrition and fluids offered: Yes  Toileting and hygiene offered: Yes  Sitter present: q15 minute observations and security camera monitoring Law enforcement present: yes  ODS

## 2014-06-08 NOTE — ED Notes (Signed)
Pt to ED BHU 2 Patient assigned to appropriate care area. Patient oriented to unit/care area: Informed that, for their safety, care areas are designed for safety and monitored by security cameras at all times; and visiting hours explained to patient. Patient verbalizes understanding, and verbal contract for safety obtained.  Pt with no verbalized needs or concerns at this time

## 2014-06-08 NOTE — ED Notes (Signed)
BEHAVIORAL HEALTH ROUNDING Patient sleeping: Yes.   Patient alert and oriented: not applicable Behavior appropriate: Yes.   Nutrition and fluids offered: Yes  Toileting and hygiene offered: Yes  Sitter present: yes Law enforcement present: Yes  

## 2014-06-08 NOTE — ED Notes (Signed)

## 2014-06-08 NOTE — Consult Note (Signed)
[place] [facility] Consult   Reason for Consult:  Patient under IVC with reports of agitated threatening behavior and bipolar disorder Referring Physician:  quale Patient Identification: Ellen Morrison MRN:  [id number] Principal Diagnosis: Bipolar disorder, current episode manic severe with psychotic features Diagnosis:   Patient Active Problem List   Diagnosis Date Noted  . Bipolar disorder, current episode manic severe with psychotic features [F31.2] 06/08/2014    Total Time spent with patient: 1 hour  Subjective:   Ellen Morrison is a 34 y.o. female patient admitted with "I had a manic episode" patient says that she's been off medicines for months got agitated hallucinating acting violently.  HPI:  Patient reports that she had what she calls a manic episode. It lasted for several hours yesterday. She hadn't slept in several days. Started having escalation of her auditory hallucinations with several voices talking to her. Got confused and agitated and trashed a lot of things around her home. Threatening to kill her family and kill herself. Patient can't think of any specific stress that set this off. She's been off of her medicines for 2 or 3 months. She denies that she's been using alcohol or drugs.  Patient has a history of bipolar disorder and has had both manic and depressive symptoms in the past. History of past serious suicide attempts as well as aggression. Most recently was on [place] at [employer] but didn't think it was helping. Previous medicines include Geodon she can't remember anything else. Positive prior hospitalizations.  Substance abuse history is negative for alcohol or drug abuse by her report.  Medical history overweight but denies any other significant medical problems  Family history positive for schizophrenia in her mother  Current medications none  Social history: Says that she is currently living at home HPI Elements:   Quality:  Agitated hostile  behavior along with hallucinations. Severity:  Severe. Timing:  Particularly bad last night for several hours. Duration:  Had been going on escalating for a few days. Context:  Off medicine for months.  Past Medical History:  Past Medical History  Diagnosis Date  . Preeclampsia 7 years ago     with first pregnancy  . Bipolar disease, manic     Past Surgical History  Procedure Laterality Date  . Cesarean section  7 years ago   Family History:  Family History  Problem Relation Age of Onset  . Other Neg Hx    Social History:  History  Alcohol Use No     History  Drug Use  . Yes  . Special: Marijuana    History   Social History  . Marital Status: Single    Spouse Name: N/A  . Number of Children: N/A  . Years of Education: N/A   Social History Main Topics  . Smoking status: Current Every Day Smoker -- 1.00 packs/day    Types: Cigarettes  . Smokeless tobacco: Never Used  . Alcohol Use: No  . Drug Use: Yes    Special: Marijuana  . Sexual Activity: Yes    Birth Control/ Protection: Other-see comments     Comment: tubal ligation   Other Topics Concern  . None   Social History Narrative   Additional Social History:    Pain Medications: unable to assess Prescriptions: unable to assess Over the Counter: unable to assess History of alcohol / drug use?: Yes Longest period of sobriety (when/how long): unable to assess Name of Substance 1: Cocaine Name of Substance 2: THC  Allergies:   Allergies  Allergen Reactions  . Geodon [Ziprasidone Hcl] Other (See Comments)    Pt states that is causes her neck to twist    Labs:  Results for orders placed or performed during the hospital encounter of 06/08/14 (from the past 48 hour(s))  Acetaminophen level     Status: Abnormal   Collection Time: 06/07/14 10:40 PM  Result Value Ref Range   Acetaminophen (Tylenol), Serum <10 (L) 10 - 30 ug/mL    Comment:        THERAPEUTIC CONCENTRATIONS  VARY SIGNIFICANTLY. A RANGE OF 10-30 ug/mL MAY BE AN EFFECTIVE CONCENTRATION FOR MANY PATIENTS. HOWEVER, SOME ARE BEST TREATED AT CONCENTRATIONS OUTSIDE THIS RANGE. ACETAMINOPHEN CONCENTRATIONS >150 ug/mL AT 4 HOURS AFTER INGESTION AND >50 ug/mL AT 12 HOURS AFTER INGESTION ARE OFTEN ASSOCIATED WITH TOXIC REACTIONS.   CBC     Status: Abnormal   Collection Time: 06/07/14 10:40 PM  Result Value Ref Range   WBC 8.7 3.6 - 11.0 K/uL   RBC 4.02 3.80 - 5.20 MIL/uL   Hemoglobin 11.4 (L) 12.0 - 16.0 g/dL   HCT 35.2 35.0 - 47.0 %   MCV 87.6 80.0 - 100.0 fL   MCH 28.3 26.0 - 34.0 pg   MCHC 32.3 32.0 - 36.0 g/dL   RDW 15.9 (H) 11.5 - 14.5 %   Platelets 306 150 - 440 K/uL  Comprehensive metabolic panel     Status: Abnormal   Collection Time: 06/07/14 10:40 PM  Result Value Ref Range   Sodium 139 135 - 145 mmol/L   Potassium 3.9 3.5 - 5.1 mmol/L   Chloride 105 101 - 111 mmol/L   CO2 27 22 - 32 mmol/L   Glucose, Bld 103 (H) 65 - 99 mg/dL   BUN 14 6 - 20 mg/dL   Creatinine, Ser 0.94 0.44 - 1.00 mg/dL   Calcium 8.9 8.9 - 10.3 mg/dL   Total Protein 6.9 6.5 - 8.1 g/dL   Albumin 3.7 3.5 - 5.0 g/dL   AST 16 15 - 41 U/L   ALT 11 (L) 14 - 54 U/L   Alkaline Phosphatase 60 38 - 126 U/L   Total Bilirubin 0.2 (L) 0.3 - 1.2 mg/dL   GFR calc non Af Amer >60 >60 mL/min   GFR calc Af Amer >60 >60 mL/min    Comment: (NOTE) The eGFR has been calculated using the CKD EPI equation. This calculation has not been validated in all clinical situations. eGFR's persistently <60 mL/min signify possible Chronic Kidney Disease.    Anion gap 7 5 - 15  Ethanol (ETOH)     Status: None   Collection Time: 06/07/14 10:40 PM  Result Value Ref Range   Alcohol, Ethyl (B) <5 <5 mg/dL    Comment:        LOWEST DETECTABLE LIMIT FOR SERUM ALCOHOL IS 11 mg/dL FOR MEDICAL PURPOSES ONLY   Salicylate level     Status: None   Collection Time: 06/07/14 10:40 PM  Result Value Ref Range   Salicylate Lvl <6.2 2.8 -  30.0 mg/dL  Urinalysis complete, with microscopic [facility])     Status: Abnormal   Collection Time: 06/07/14 11:45 PM  Result Value Ref Range   Color, Urine YELLOW (A) YELLOW   APPearance CLOUDY (A) CLEAR   Glucose, UA NEGATIVE NEGATIVE mg/dL   Bilirubin Urine NEGATIVE NEGATIVE   Ketones, ur NEGATIVE NEGATIVE mg/dL   Specific Gravity, Urine 1.018 1.005 - 1.030   Hgb urine dipstick 3+ (A)  NEGATIVE   pH 8.0 5.0 - 8.0   Protein, ur 30 (A) NEGATIVE mg/dL   Nitrite NEGATIVE NEGATIVE   Leukocytes, UA 3+ (A) NEGATIVE   RBC / HPF TOO NUMEROUS TO COUNT 0 - 5 RBC/hpf   WBC, UA TOO NUMEROUS TO COUNT 0 - 5 WBC/hpf   Bacteria, UA NONE SEEN NONE SEEN   Squamous Epithelial / LPF 0-5 (A) NONE SEEN   WBC Clumps PRESENT   Urine Drug Screen, [facility])     Status: Abnormal   Collection Time: 06/07/14 11:45 PM  Result Value Ref Range   Tricyclic, Ur Screen NONE DETECTED NONE DETECTED   Amphetamines, Ur Screen NONE DETECTED NONE DETECTED   MDMA (Ecstasy)Ur Screen NONE DETECTED NONE DETECTED   Cocaine Metabolite,Ur [place] POSITIVE (A) NONE DETECTED   Opiate, Ur Screen NONE DETECTED NONE DETECTED   Phencyclidine (PCP) Ur S NONE DETECTED NONE DETECTED   Cannabinoid 50 Ng, Ur [place] POSITIVE (A) NONE DETECTED   Barbiturates, Ur Screen NONE DETECTED NONE DETECTED   Benzodiazepine, Ur Scrn NONE DETECTED NONE DETECTED   Methadone Scn, Ur NONE DETECTED NONE DETECTED    Comment: (NOTE) 983  Tricyclics, urine               Cutoff 1000 ng/mL 200  Amphetamines, urine             Cutoff 1000 ng/mL 300  MDMA (Ecstasy), urine           Cutoff 500 ng/mL 400  Cocaine Metabolite, urine       Cutoff 300 ng/mL 500  Opiate, urine                   Cutoff 300 ng/mL 600  Phencyclidine (PCP), urine      Cutoff 25 ng/mL 700  Cannabinoid, urine              Cutoff 50 ng/mL 800  Barbiturates, urine             Cutoff 200 ng/mL 900  Benzodiazepine, urine           Cutoff 200 ng/mL 1000 Methadone, urine                Cutoff  300 ng/mL 1100 1200 The urine drug screen provides only a preliminary, unconfirmed 1300 analytical test result and should not be used for non-medical 1400 purposes. Clinical consideration and professional judgment should 1500 be applied to any positive drug screen result due to possible 1600 interfering substances. A more specific alternate chemical method 1700 must be used in order to obtain a confirmed analytical result.  1800 Gas chromato graphy / mass spectrometry (GC/MS) is the preferred 1900 confirmatory method.     Vitals: Blood pressure 110/73, pulse 73, temperature 97.5 F (36.4 C), temperature source Oral, resp. rate 18, height 5' 3" (1.6 m), weight 96.163 kg (212 lb), SpO2 100 %, unknown if currently breastfeeding.  Risk to Self: Suicidal Ideation: Yes-Currently Present Suicidal Intent: No-Not Currently/Within Last 6 Months Is patient at risk for suicide?: Yes Suicidal Plan?: No-Not Currently/Within Last 6 Months Access to Means: No What has been your use of drugs/alcohol within the last 12 months?: Cocaine, THC How many times?: 1 Triggers for Past Attempts: Unknown (unable to assess) Intentional Self Injurious Behavior: None (unable to assess) Risk to Others: Homicidal Ideation: No-Not Currently/Within Last 6 Months Thoughts of Harm to Others: No-Not Currently Present/Within Last 6 Months Current Homicidal Intent: No-Not Currently/Within Last 6 Months Current  Homicidal Plan: No-Not Currently/Within Last 6 Months Access to Homicidal Means: No History of harm to others?: No Assessment of Violence: None Noted Does patient have access to weapons?: No Criminal Charges Pending?: No Does patient have a court date: No Prior Inpatient Therapy: Prior Inpatient Therapy: Yes Prior Therapy Dates: unknown Prior Therapy Facilty/Provider(s): unknown Reason for Treatment: Manie episode Prior Outpatient Therapy: Prior Outpatient Therapy: Yes Prior Therapy Facilty/Provider(s):  [facility]; Reason for Treatment: Bipolar Does patient have an ACCT team?: No Does patient have Intensive In-House Services?  : No Does patient have [facility] services? : No Does patient have P4CC services?: No  Current Facility-Administered Medications  Medication Dose Route Frequency Provider Last Rate Last Dose  . cephALEXin (KEFLEX) capsule 500 mg  500 mg Oral 3 times per day Loney Hering, MD   500 mg at 06/08/14 1454  . OLANZapine (ZYPREXA) tablet 5 mg  5 mg Oral QHS Loney Hering, MD   5 mg at 06/08/14 0100   Current Outpatient Prescriptions  Medication Sig Dispense Refill  . Lurasidone HCl (LATUDA) 20 MG TABS Take 1 tablet by mouth daily.    . nitrofurantoin, macrocrystal-monohydrate, (MACROBID) 100 MG capsule Take 1 capsule (100 mg total) by mouth 2 (two) times daily. X 7 days 14 capsule 0  . oxyCODONE-acetaminophen (PERCOCET) 5-325 MG per tablet Take 1-2 tablets by mouth every 4 (four) hours as needed. 20 tablet 0    Musculoskeletal: Strength & Muscle Tone: within normal limits Gait & Station: ataxic Patient leans: N/A  Psychiatric Specialty Exam: Physical Exam  Constitutional: She appears well-developed and well-nourished.  HENT:  Head: Normocephalic.  Eyes: Conjunctivae are normal. Pupils are equal, round, and reactive to light.  Neck: Normal range of motion.  Respiratory: Effort normal.  Musculoskeletal: Normal range of motion.  Neurological: She is alert.  Skin: Skin is warm and dry.  Psychiatric: Her mood appears anxious. Her affect is labile. Her speech is delayed and slurred. She is slowed and withdrawn. Thought content is delusional. Cognition and memory are impaired. She expresses impulsivity. She exhibits abnormal recent memory.    Review of Systems  Constitutional: Negative.   HENT: Negative.   Eyes: Negative.   Respiratory: Negative.   Cardiovascular: Negative.   Gastrointestinal: Negative.   Musculoskeletal: Negative.   Skin: Negative.    Psychiatric/Behavioral: Positive for depression, suicidal ideas and hallucinations. Negative for substance abuse. The patient is nervous/anxious and has insomnia.     Blood pressure 110/73, pulse 73, temperature 97.5 F (36.4 C), temperature source Oral, resp. rate 18, height 5' 3" (1.6 m), weight 96.163 kg (212 lb), SpO2 100 %, unknown if currently breastfeeding.Body mass index is 37.56 kg/(m^2).  General Appearance: Disheveled  Eye Contact::  Absent  Speech:  Garbled and Slow  Volume:  Decreased  Mood:  Depressed  Affect:  Depressed and Flat  Thought Process:  Disorganized  Orientation:  Full (Time, Place, and Person)  Thought Content:  Hallucinations: Auditory and Paranoid Ideation  Suicidal Thoughts:  Yes.  without intent/plan  Homicidal Thoughts:  Yes.  without intent/plan  Memory:  Immediate;   Fair Recent;   Poor Remote;   Fair  Judgement:  Impaired  Insight:  Present  Psychomotor Activity:  Decreased  Concentration:  Fair  Recall:  [employer] of Knowledge:Fair  Language: Poor  Akathisia:  No  Handed:  Right  AIMS (if indicated):     Assets:  Desire for Improvement Financial Resources/Insurance Social Support  ADL's:  Intact  Cognition: Impaired,  Moderate  Sleep:      Medical Decision Making: Review of Psycho-Social Stressors (1), Established Problem, Worsening (2), Review of Last Therapy Session (1), Review or order medicine tests (1), Independent Review of image, tracing or specimen (2) and Review of Medication Regimen & Side Effects (2)  Treatment Plan Summary: Medication management and Plan Patient is currently somewhat sedated and withdrawn having been given some medicine but prior to that had several days of no sleep with agitation and aggressive behavior. Continues to endorse hallucinations as well as suicidal and homicidal ideation. Off medicine. Patient meets commitment criteria requires inpatient treatment  Plan:  Recommend psychiatric Inpatient admission  when medically cleared. Supportive therapy provided about ongoing stressors. Disposition: Patient will be admitted to psychiatry. Restart Latuda her previous medication. Monitor vital signs.  ,  06/08/2014 5:30 PM

## 2014-06-08 NOTE — ED Notes (Signed)
BEHAVIORAL HEALTH ROUNDING Patient sleeping: Yes.   Patient alert and oriented: yes Behavior appropriate: Yes.   Nutrition and fluids offered: No-pt sleeping Toileting and hygiene offered: No-pt sleeping Sitter present: yes Law enforcement present: Yes   

## 2014-06-08 NOTE — ED Notes (Signed)
Transfer to BHU  

## 2014-06-08 NOTE — Progress Notes (Signed)
Patient was sedated and is unable to be aroused to complete the intake assessment

## 2014-06-08 NOTE — ED Notes (Signed)
BEHAVIORAL HEALTH ROUNDING Patient sleeping: Yes.   Patient alert and oriented: not applicable Behavior appropriate: Yes.  ; If no, describe:  Nutrition and fluids offered: Yes  Toileting and hygiene offered: Yes  Sitter present: no Law enforcement present: Yes  

## 2014-06-08 NOTE — ED Notes (Signed)
Pt to be admitted to [facility]  Pt observed lying in bed  NAD observed

## 2014-06-08 NOTE — ED Notes (Addendum)

## 2014-06-08 NOTE — ED Notes (Signed)
Patient eating [place] sandwich and grape juice. Patient using phone. Denies any complaints at this time. Will continue to monitor.

## 2014-06-08 NOTE — ED Notes (Signed)
Spoke with Maxine GlennMonica, [facility], unit awaiting patient arrival.

## 2014-06-08 NOTE — ED Notes (Signed)
PT  MOVED TO  BHU  ALL   IVC PAPERWORK ON  CHART  PENDING  PLACEMENT  TO BE[facility]  MED UNIT  PER  MARGARET  HOLLAND

## 2014-06-08 NOTE — ED Provider Notes (Signed)
[facility] Emergency Department Provider Note  ____________________________________________  Time seen: Approximately 0034 AM  I have reviewed the triage vital signs and the nursing notes.   HISTORY  Chief Complaint Mental Health Problem    HPI Ellen Morrison is a 34 y.o. female who comes into the hospital hearing voices and reporting that she is having a manic episode. The patient reports that she does not want to be here. She reports that she has been unable to sleep because of the voices. She reports that all she wants to do is be able to rest. She reports that she has been on the 2 and Geodon but they have caused side effects which is why she no longer takes them. The patient reports that she was brought by her best friend and her family because she told them that she would hurt herself or hurt them. The patient reports that she did not mean what she said and is frustrated that she has to be in the hospital. The patient reports that the voices told her to come to [facility] for evaluation of her symptoms. The patient has been here in the hospital in the past. The patient reports that she has had increased hunger and is feeling very anxious. She denies any alcohol or any drugs. She reports that she does have a history of a suicide attempt in the past. She reports also tonight that she was trying to fight many people once again which is why they brought her into the hospital. She denies feeling depressed.   Past Medical History  Diagnosis Date  . Preeclampsia 7 years ago     with first pregnancy  . Bipolar disease, manic     There are no active problems to display for this patient.   Past Surgical History  Procedure Laterality Date  . Cesarean section  7 years ago    Current Outpatient Rx  Name  Route  Sig  Dispense  Refill  . Lurasidone HCl (LATUDA) 20 MG TABS   Oral   Take 1 tablet by mouth daily.         . nitrofurantoin,  macrocrystal-monohydrate, (MACROBID) 100 MG capsule   Oral   Take 1 capsule (100 mg total) by mouth 2 (two) times daily. X 7 days   14 capsule   0   . oxyCODONE-acetaminophen (PERCOCET) 5-325 MG per tablet   Oral   Take 1-2 tablets by mouth every 4 (four) hours as needed.   20 tablet   0     Allergies Geodon  Family History  Problem Relation Age of Onset  . Other Neg Hx     Social History History  Substance Use Topics  . Smoking status: Current Every Day Smoker -- 1.00 packs/day    Types: Cigarettes  . Smokeless tobacco: Never Used  . Alcohol Use: No    Review of Systems Constitutional: No fever/chills Eyes: No visual changes. ENT: No sore throat. Cardiovascular: Denies chest pain. Respiratory: Denies shortness of breath. Gastrointestinal: No abdominal pain.  No nausea, no vomiting.   Genitourinary: Negative for dysuria. Musculoskeletal: Negative for back pain. Skin: Negative for rash. Neurological: Negative for headaches, focal weakness or numbness. Psychiatric:Anxiety, auditory hallucinations 10-point ROS otherwise negative.  ____________________________________________   PHYSICAL EXAM:  VITAL SIGNS: ED Triage Vitals  Enc Vitals Group     BP 06/07/14 2337 130/88 mmHg     Pulse Rate 06/07/14 2337 86     Resp --  Temp 06/07/14 2337 98.1 F (36.7 C)     Temp src --      SpO2 06/07/14 2337 100 %     Weight 06/07/14 2337 212 lb (96.163 kg)     Height 06/07/14 2337 5\' 3"  (1.6 m)     Head Cir --      Peak Flow --      Pain Score --      Pain Loc --      Pain Edu? --      Excl. in GC? --     Constitutional: Alert and oriented. The patient is disheveled and crying. Eyes: Conjunctivae are normal. PERRL. EOMI. Head: Atraumatic. Nose: No congestion/rhinnorhea. Mouth/Throat: Mucous membranes are moist.  Oropharynx non-erythematous. Cardiovascular: Normal rate, regular rhythm. Grossly normal heart sounds.  Good peripheral circulation. Respiratory:  Normal respiratory effort.  No retractions. Lungs CTAB. Gastrointestinal: Soft and nontender. No distention.  Genitourinary: Deferred Musculoskeletal: No lower extremity tenderness nor edema.  No joint effusions. Neurologic:  Normal speech and language. No gross focal neurologic deficits are appreciated. Speech is normal. No gait instability. Skin:  Skin is warm, dry and intact. No rash noted. Psychiatric: Emotionally labile Speech and behavior are normal.  ____________________________________________   LABS (all labs ordered are listed, but only abnormal results are displayed)  Labs Reviewed  CBC - Abnormal; Notable for the following:    Hemoglobin 11.4 (*)    RDW 15.9 (*)    All other components within normal limits  COMPREHENSIVE METABOLIC PANEL - Abnormal; Notable for the following:    Glucose, Bld 103 (*)    ALT 11 (*)    Total Bilirubin 0.2 (*)    All other components within normal limits  URINALYSIS COMPLETEWITH MICROSCOPIC ([facility])  - Abnormal; Notable for the following:    Color, Urine YELLOW (*)    APPearance CLOUDY (*)    Hgb urine dipstick 3+ (*)    Protein, ur 30 (*)    Leukocytes, UA 3+ (*)    Squamous Epithelial / LPF 0-5 (*)    All other components within normal limits  URINE DRUG SCREEN, QUALITATIVE ([facility]) - Abnormal; Notable for the following:    Cocaine Metabolite,Ur [place] POSITIVE (*)    Cannabinoid 50 Ng, Ur [place] POSITIVE (*)    All other components within normal limits  ACETAMINOPHEN LEVEL  ETHANOL  SALICYLATE LEVEL  POC URINE PREG, ED   ____________________________________________  EKG  None ____________________________________________  RADIOLOGY  None ____________________________________________   PROCEDURES  Procedure(s) performed: None  Critical Care performed: No  ____________________________________________   INITIAL IMPRESSION / ASSESSMENT AND PLAN / ED COURSE  Pertinent labs & imaging results that were available during my care  of the patient were reviewed by me and considered in my medical decision making (see chart for details).  This is a 34 year old female who comes in with a manic episode and auditory hallucinations. The patient is also having some suicidal and homicidal thoughts. The patient reports that she has been unable to sleep and does seem agitated. I will give the patient a dose of Zyprexa 5 mg by mouth 1 as well as Ativan 2 mg by mouth 1. I will have the patient seen and evaluated by the behavioral health nurse and determine if the patient needs any further management inpatient.  The patient will be seen by psych ____________________________________________   FINAL CLINICAL IMPRESSION(S) / ED DIAGNOSES  Final diagnoses:  Auditory hallucinations  Schizoaffective disorder       Revonda Standardllison  Catalina Gravel, MD 06/08/14 [phone]

## 2014-06-08 NOTE — ED Notes (Signed)
BEHAVIORAL HEALTH ROUNDING Patient sleeping: No. Patient alert and oriented: yes Behavior appropriate: Yes.  ; If no, describe:  Nutrition and fluids offered: Yes  Toileting and hygiene offered: Yes  Sitter present: q15 minute observations Law enforcement present: yes  [facility] 

## 2014-06-08 NOTE — ED Notes (Signed)
Called lab regarding patient's lab work:  etoh level, acetaminophen, and salicylate levels.  Lab stated they are running tests at this time and will result shortly.  Patient is resting comfortably, no obvious distress at this time.

## 2014-06-08 NOTE — ED Provider Notes (Signed)
Patient admitted to behavioral medicine  Sharyn CreamerMark Zaire Levesque, MD 06/08/14 2236

## 2014-06-08 NOTE — ED Notes (Signed)

## 2014-06-08 NOTE — ED Notes (Signed)
BEHAVIORAL HEALTH ROUNDING Patient sleeping: Yes.   Patient alert and oriented: yes Behavior appropriate: Yes.  ;  Nutrition and fluids offered: Yes  Toileting and hygiene offered: Yes  Sitter present: yes Law enforcement present: Yes  

## 2014-06-08 NOTE — ED Notes (Signed)
BEHAVIORAL HEALTH ROUNDING Patient sleeping: No. Patient alert and oriented: yes Behavior appropriate: Yes.  ; If no, describe:  Nutrition and fluids offered: Yes  Toileting and hygiene offered: Yes  Sitter present: no Law enforcement present: Yes  

## 2014-06-08 NOTE — ED Notes (Signed)
.  [facility] 

## 2014-06-08 NOTE — BH Assessment (Signed)
Assessment Note  Ellen Morrison is an 34 y.o. female. Patient was brought into the ED because of manic symptoms.  Patient reports current suicidal ideation with no plan at this time.  Patient reports having a pattern of manic episodes every 2-3 years and needing hospitalization.  Patient reports having 3 sons at home but their father taking care of them at this time.  Patient presents with drowsiness and still some sedation.  Patient gave this Clinical research associatewriter permission to speak with her family.  CSW attempted to contact the emergency contact on her profile but not able to leave message.    Patient tested positive for cocaine and marijuana but unable to provide detail of substance use.    CSW spoke with [facility] who reports the patient had he first appointment on yesterday but missed the visit. This was the patient's first visit since October of 2015.    Disposition is Pending.    Axis I: Bipolar, Manic and Cocaine, use, and Marijuana use,  Axis II: Deferred Axis III:  Past Medical History  Diagnosis Date  . Preeclampsia 7 years ago     with first pregnancy  . Bipolar disease, manic    Axis IV: other psychosocial or environmental problems, problems related to social environment, problems with access to health care services and problems with primary support group Axis V: 41-50 serious symptoms  Past Medical History:  Past Medical History  Diagnosis Date  . Preeclampsia 7 years ago     with first pregnancy  . Bipolar disease, manic     Past Surgical History  Procedure Laterality Date  . Cesarean section  7 years ago    Family History:  Family History  Problem Relation Age of Onset  . Other Neg Hx     Social History:  reports that she has been smoking Cigarettes.  She has been smoking about 1.00 pack per day. She has never used smokeless tobacco. She reports that she uses illicit drugs (Marijuana). She reports that she does not drink alcohol.  Additional Social  History:  Alcohol / Drug Use Pain Medications: unable to assess Prescriptions: unable to assess Over the Counter: unable to assess History of alcohol / drug use?: Yes Longest period of sobriety (when/how long): unable to assess Substance #1 Name of Substance 1: Cocaine Substance #2 Name of Substance 2: THC  CIWA: CIWA-Ar BP: 110/73 mmHg Pulse Rate: 73 COWS:    Allergies:  Allergies  Allergen Reactions  . Geodon [Ziprasidone Hcl] Other (See Comments)    Pt states that is causes her neck to twist    Home Medications:  (Not in a hospital admission)  OB/GYN Status:  No LMP recorded.  General Assessment Data [facility] Is this a Tele or Face-to-Face Assessment?: Face-to-Face Is this an Initial Assessment or a Re-assessment for this encounter?: Initial Assessment Marital status: Single Is patient pregnant?: No Pregnancy Status: No Living Arrangements: Children Can pt return to current living arrangement?: Yes Admission Status: Voluntary Is patient capable of signing voluntary admission?: Yes Referral Source: Self/Family/Friend  Medical Screening Exam [facility] Walk-in ONLY) Medical Exam completed: Yes  Crisis Care Plan Living Arrangements: Children [facility] Psychiatrist: [facility]  Education Status Is patient currently in school?: No  Risk to self with the past 6 months Suicidal Ideation: Yes-Currently Present Has patient been a risk to self within the past 6 months prior to admission? : Yes Suicidal Intent: No-Not Currently/Within Last  6 Months Has patient had any suicidal intent within the past 6 months prior to admission? : No Is patient at risk for suicide?: Yes Suicidal Plan?: No-Not Currently/Within Last 6 Months Has patient had any suicidal plan within the past 6 months prior to admission? : Yes Access to Means: No What has been your use of drugs/alcohol within the last 12 months?:  Cocaine, THC Previous Attempts/Gestures: Yes How many times?: 1 Triggers for Past Attempts: Unknown (unable to assess) Intentional Self Injurious Behavior: None (unable to assess) Family Suicide History: Unable to assess Recent stressful life event(s): Other (Comment) (unable to assess) Persecutory voices/beliefs?: Yes Depression: Yes Depression Symptoms:  (unable to assess) Substance abuse history and/or treatment for substance abuse?: Yes  Risk to Others within the past 6 months Homicidal Ideation: No-Not Currently/Within Last 6 Months Does patient have any lifetime risk of violence toward others beyond the six months prior to admission? : No Thoughts of Harm to Others: No-Not Currently Present/Within Last 6 Months Current Homicidal Intent: No-Not Currently/Within Last 6 Months Current Homicidal Plan: No-Not Currently/Within Last 6 Months Access to Homicidal Means: No History of harm to others?: No Assessment of Violence: None Noted Does patient have access to weapons?: No Criminal Charges Pending?: No Does patient have a court date: No Is patient on probation?: No  Psychosis Hallucinations: Auditory, With command  Mental Status Report Appearance/Hygiene: In hospital gown Eye Contact: Poor Motor Activity: Unable to assess Speech: Slurred, Slow Level of Consciousness: Drowsy Mood: Depressed Affect: Unable to Assess Anxiety Level: None Thought Processes: Unable to Assess Judgement: Unable to Assess Orientation: Person, Place Obsessive Compulsive Thoughts/Behaviors: Unable to Assess  Cognitive Functioning Concentration: Unable to Assess Memory: Unable to Assess IQ: Average Insight: Unable to Assess Impulse Control: Unable to Assess Appetite:  (unable to assess) Sleep: Unable to Assess Vegetative Symptoms: Unable to Assess  ADLScreening [facility] Assessment Services) Patient's cognitive ability adequate to safely complete daily activities?: Yes Patient able to express  need for assistance with ADLs?: Yes Independently performs ADLs?: Yes (appropriate for developmental age)  Prior Inpatient Therapy Prior Inpatient Therapy: Yes Prior Therapy Dates: unknown Prior Therapy Facilty/Provider(s): unknown Reason for Treatment: Manie episode  Prior Outpatient Therapy Prior Outpatient Therapy: Yes Prior [facility]ilty[facility]; Reason for Treatment: Bipolar Does patient have an ACCT team?: No Does patient have Intensive In-House Services?  : No Does patient have [facility] services? : No Does patient have P4CC services?: No  ADL Screening (condition at time of admission) Patient's cognitive ability adequate to safely complete daily activities?: Yes Patient able to express need for assistance with ADLs?: Yes Independently performs ADLs?: Yes (appropriate for developmental age)       Abuse/Neglect Assessment (Assessment to be complete while patient is alone) Physical Abuse: Denies, provider concerned (Comment) (unable to assess) Verbal Abuse: Denies, provider concerned (Comment) (unable to assess) Sexual Abuse: Denies, provider concered (Comment) (unable to assess) Exploitation of patient/patient's resources: Denies, provider concerned (Comment) (unable to assess) Self-Neglect: Denies, provider concerned (Comment) (unable to assess) Values / Beliefs Spiritual Requests During Hospitalization: None (unable to assess) Consults Spiritual Care Consult Needed: No (unable to assess) Social Work Consult Needed: No (unable to assess) Merchant navy officerAdvance Directives (For Healthcare) Does patient have an advance directive?: No Would patient like information on creating an advanced directive?: No - patient declined information Nutrition Screen- [facility] Adult/WL/AP Patient's home diet: Regular  Additional Information 1:1 In Past 12 Months?: No CIRT Risk: No Elopement Risk: No Does patient have medical clearance?: Yes  Disposition:   Disposition Initial Assessment Completed for this Encounter: Yes Disposition of Patient: Other dispositions Other disposition(s): Other (Comment) (Pending)  On Site Evaluation by:   Reviewed with Physician:    Maryelizabeth Rowan A 06/08/2014 11:50 AM

## 2014-06-08 NOTE — ED Notes (Addendum)
ED BHU PLACEMENT JUSTIFICATION Is the patient under IVC or is there intent for IVC: Yes.   Is the patient medically cleared: Yes.   Is there vacancy in the ED BHU: Yes.   Is the population mix appropriate for patient: Yes.   Is the patient awaiting placement in inpatient or outpatient setting: Yes.   Has the patient had a psychiatric consult: Yes.   Survey of unit performed for contraband, proper placement and condition of furniture, tampering with fixtures in bathroom, shower, Yes.  ; Findings:  APPEARANCE/BEHAVIOR Calm and cooperative. NEURO ASSESSMENT Orientation: oriented x3,denies pain Hallucinations: No.None noted (Hallucinations) Speech: Normal Gait: normal RESPIRATORY ASSESSMENT Respirations even and unlabored noted. CARDIOVASCULAR ASSESSMENT Pulses equal, skin warm and dry. GASTROINTESTINAL ASSESSMENT No GI complaints at this time. EXTREMITIES Full ROM PLAN OF CARE Provide calm/safe environment. Vital signs assessed twice daily. ED BHU Assessment once each 12-hour shift. Collaborate with intake RN daily or as condition indicates. Assure the ED provider has rounded once each shift. Provide and encourage hygiene. Provide redirection as needed. Assess for escalating behavior; address immediately and inform ED provider.  Assess family dynamic and appropriateness for visitation as needed: Yes.  ; If necessary, describe findings:  Educate the patient/family about BHU procedures/visitation: Yes.  ; If necessary, describe findings:

## 2014-06-08 NOTE — ED Provider Notes (Signed)
-----------------------------------------   7:32 AM on 06/08/2014 -----------------------------------------   BP 130/88 mmHg  Pulse 86  Temp(Src) 98.1 F (36.7 C)  Ht 5\' 3"  (1.6 m)  Wt 212 lb (96.163 kg)  BMI 37.56 kg/m2  SpO2 100%  The patient had no acute events since last update.  Calm and cooperative at this time.  Disposition is pending per Psychiatry/Behavioral Medicine team recommendations.     Myrna Blazeravid Matthew Schaevitz, MD 06/08/14 [phone]

## 2014-06-08 NOTE — ED Notes (Signed)
Patient resting in bed, patient requesting Nicotine patch. MD made aware. NAD noted at this time.

## 2014-06-08 NOTE — ED Notes (Signed)

## 2014-06-08 NOTE — ED Notes (Signed)
BEHAVIORAL HEALTH ROUNDING Patient sleeping: Yes.   Patient alert and oriented: not applicable Behavior appropriate: Yes.  ; If no, describe:  Nutrition and fluids offered: No Toileting and hygiene offered: No Sitter present: no Law enforcement present: Yes  

## 2014-06-08 NOTE — ED Notes (Signed)
BEHAVIORAL HEALTH ROUNDING Patient sleeping: Yes.   Patient alert and oriented: not applicable Behavior appropriate: Yes.  ; If no, describe: sleeping Nutrition and fluids offered: Yes  Toileting and hygiene offered: Yes  Sitter present: not applicable Law enforcement present: Yes  

## 2014-06-08 NOTE — Plan of Care (Signed)
Problem: [facility] Patient Education Outcome: Not Progressing Patient admitted for depression and suicidal thoughts, this is her first day of treatment.

## 2014-06-09 DIAGNOSIS — F1590 Other stimulant use, unspecified, uncomplicated: Secondary | ICD-10-CM

## 2014-06-09 DIAGNOSIS — F1220 Cannabis dependence, uncomplicated: Secondary | ICD-10-CM | POA: Diagnosis present

## 2014-06-09 DIAGNOSIS — F6089 Other specific personality disorders: Secondary | ICD-10-CM | POA: Diagnosis present

## 2014-06-09 DIAGNOSIS — F332 Major depressive disorder, recurrent severe without psychotic features: Secondary | ICD-10-CM | POA: Diagnosis present

## 2014-06-09 DIAGNOSIS — F17200 Nicotine dependence, unspecified, uncomplicated: Secondary | ICD-10-CM | POA: Diagnosis present

## 2014-06-09 DIAGNOSIS — F4310 Post-traumatic stress disorder, unspecified: Secondary | ICD-10-CM | POA: Diagnosis present

## 2014-06-09 HISTORY — DX: Other stimulant use, unspecified, uncomplicated: F15.90

## 2014-06-09 HISTORY — DX: Cannabis dependence, uncomplicated: F12.20

## 2014-06-09 MED ORDER — HALOPERIDOL 0.5 MG PO TABS
0.5000 mg | ORAL_TABLET | Freq: Three times a day (TID) | ORAL | Status: DC
  Administered 2014-06-09 – 2014-06-11 (×6): 0.5 mg via ORAL
  Filled 2014-06-09 (×6): qty 1

## 2014-06-09 MED ORDER — FLUOXETINE HCL 20 MG PO CAPS
20.0000 mg | ORAL_CAPSULE | Freq: Every day | ORAL | Status: DC
  Administered 2014-06-09 – 2014-06-11 (×3): 20 mg via ORAL
  Filled 2014-06-09 (×3): qty 1

## 2014-06-09 MED ORDER — NICOTINE 10 MG IN INHA
1.0000 | RESPIRATORY_TRACT | Status: DC | PRN
  Administered 2014-06-09: 1 via RESPIRATORY_TRACT

## 2014-06-09 MED ORDER — TRAZODONE HCL 100 MG PO TABS
100.0000 mg | ORAL_TABLET | Freq: Every evening | ORAL | Status: DC | PRN
  Administered 2014-06-10: 100 mg via ORAL
  Filled 2014-06-09: qty 1

## 2014-06-09 NOTE — Progress Notes (Signed)
RD Assessment  Admitted with: bipolar PMHx: Bipolar  Current Diet: Regular Typical Food/ Fluid Intake: 10% recorded per I/O x 1 meal Meal/ Snack Patterns: Unable to assess  Supplements: None  Food Allergies: NKFA Food Preferences: Reviewed  Ht: 63" Current weight: 212# BMI: 37.6  Weight Changes: Per MST, patient reports weight loss of >34lbs; reviewed past medical records available from 2 year and 5 years ago. Weight 2 years ago- 224, weight 5 years ago- 216. No significant weight changes noted. Unable to assess recent weight history.   UOP: REviewed Digestive: Reviewed Gastrointestinal: Reviewed Skin: Reviewed Physical Findings: Reviewed  Labs: Electrolyte and Renal Profile:    Recent Labs Lab 06/07/14 2240  BUN 14  CREATININE 0.94  NA 139  K 3.9   Protein Profile:  Recent Labs Lab 06/07/14 2240  ALBUMIN 3.7    Meds: Reviewed  PES Statement: No nutrition concerns at this time.  Intervention: Meals and Snacks: Cater to patient preferences; will continue to monitor and re-assess need for additional supplementation on follow up.  Monitoring/ Evaluation: Energy Intake: goal for patient to meet >90% of estimated needs.  Joeseph Amorracey L. Gaines, RDN Pager: [phone] Office: [phone]    LOW Care Level

## 2014-06-09 NOTE — Progress Notes (Signed)
Recreation Therapy Notes  INPATIENT RECREATION THERAPY ASSESSMENT  Patient Details Name: Gardiner SleeperDanielle E Yerkes MRN: [id number] DOB: 1980/07/24 Today's Date: 06/09/2014  Patient Stressors: Family  Coping Skills:   Isolate, Arguments, Substance Abuse, Avoidance, Art/Dance, Talking, Music, Sports  Personal Challenges: Anger, Communication, Concentration, Decision-Making, Expressing Yourself, Problem-Solving, Relationships, Self-Esteem/Confidence, Social Interaction, Stress Management, Substance Abuse, Time Management, Trusting Others, Work Nutritional therapisterformance  Leisure Interests (2+):  Sports - Hotel managerwimming, Individual - Other (Comment) (Reading)  Awareness of Community Resources:  Yes  Community Resources:  [employer], [employer]  Current Use: Yes  If no, Barriers?:    Patient Strengths:  Caring and a good listener  Patient Identified Areas of Improvement:  Bipolar - sleeps all day and is awake all night  Current Recreation Participation:  Playing with children  Patient Goal for Hospitalization:  To be put on the right medication  [place]   Current [place] (including self-harm):  No  Current HI:  No  Consent to Intern Participation: N/A   Jacquelynn Creelizabeth M Kharter Brew, LRT/CTRS 06/09/2014, 12:17 PM

## 2014-06-09 NOTE — Tx Team (Signed)
Interdisciplinary Treatment Plan Update (Adult)  Date:  06/10/2014 Time Reviewed:  10:24 AM  Progress in Treatment: Attending groups: Yes. Participating in groups:  Yes. Taking medication as prescribed:  Yes. Tolerating medication:  Yes. Family/Significant othe contact made:  No, will contact:   Will call boyfriend Salomon FickJuron Rankin [phone] Patient understands diagnosis:  Yes. Discussing patient identified problems/goals with staff:  Yes. Medical problems stabilized or resolved:  Yes. Denies suicidal/homicidal ideation: Yes. Issues/concerns per patient self-inventory:  Yes. Other:  New problem(s) identified: Yes, Describe:  She reports she has substance use issues and MDD and because of recent fight not sure if she can return to boyfriends apartment  Discharge Plan or Barriers: [facility], will see if she can return to boyfriends home.  Reason for Continuation of Hospitalization: Suicidal ideation and monitoring meds Depression  Comments:  Estimated length of stay: 1 days  New goal(s):  Review of initial/current patient goals per problem list:   See plan of care  Attendees: Patient:  Ellen Morrison 5/12/201610:24 AM  Family:   5/11/20163:43 PM  Physician:  Dr Ardyth HarpsHernandez 5/12/201610:24 AM  Nursing:    5/11/20163:43 PM  Case Manager:  Arrie Senatelaudine Wrigley Winborne LCSW 5/12/201610:23 AM  Counselor:   5/11/20163:43 PM  Other:  Beryl MeagerJason Ingle LCSWA 5/12/201610:23 AM  Other:  Erskine Emeryara Mitchelle LCSW 5/12/201610:23 AM  Other:  Jake SharkSara Laws LCSW 5/12/201610:23 AM  Other: Delorse LekLynn Washington LCSW 5/12/201610:23 AM  Other:  Unk PintoHarvey Bryant RHA 5/12/201610:23 AM  Other: Smitty CordsMatt Hunter [employer] 5/12/201610:23 AM  Other:  5/11/20163:43 PM  Other:  5/11/20163:43 PM  Other:  5/11/20163:43 PM  Other:   5/11/20163:43 PM   Scribe for Treatment Team:   Cheron SchaumannBandi, Rewa Weissberg M, 06/10/2014, 10:23 AM

## 2014-06-09 NOTE — Tx Team (Signed)
Initial Interdisciplinary Treatment Plan   PATIENT STRESSORS: Bipolar symptoms   PATIENT STRENGTHS: Communication skills General fund of knowledge Supportive family/friends Work skills   PROBLEM LIST: Problem List/Patient Goals Date to be addressed Date deferred Reason deferred Estimated date of resolution  Substance Abuse 06/09/2014   06/23/2014  Depression 06/09/2014   06/23/2014                                             DISCHARGE CRITERIA:  Ability to meet basic life and health needs Improved stabilization in mood, thinking, and/or behavior Verbal commitment to aftercare and medication compliance  PRELIMINARY DISCHARGE PLAN: Return to previous living arrangement  PATIENT/FAMIILY INVOLVEMENT: This treatment plan has been presented to and reviewed with the patient, Ellen Morrison, and/or family members.  The patient and family have been given the opportunity to ask questions and make suggestions.  Ellen Morrison 06/09/2014, 12:13 AM

## 2014-06-09 NOTE — [facility] Group Notes (Signed)
[facility]  06/09/2014 10:07 AM  Participation Quality:  Did not attend  Affect:  n/a  Cognitive:  n/a  Insight:  n/a  Engagement in Group:  n/a  Modes of Intervention:  n/a  Summary of Progress/Problems: Pt did not attend group.   Beryl MeagerIngle, Leannah Guse T 06/09/2014, 10:07 AM

## 2014-06-09 NOTE — Progress Notes (Signed)
Patient admitted for Bipolar Disorder she is OX4, cooperative, she reported to Clinical research associatewriter getting into a verbal disagreement with her mother and the father of her children. After the disagreement she voiced suicidal thoughts but she does not have a current active plan. She does report being depressed and she reports depression is a 4/10 on the depression scale. She also reports audiotory hallucinations at times but denies them at this time. She was searched on admission by two RN's no contraband found. Appears to be in bed resting at this time.

## 2014-06-09 NOTE — [facility] Group Notes (Signed)
[facility] LCSW Group Therapy  06/09/2014 2:34 PM  Type of Therapy:  Group Therapy  Participation Level:  Did Not Attend  Participation Quality:  n/a  Affect:  n/a  Cognitive:  n/a  Insight:  n/a  Engagement in Therapy:  n/a  Modes of Intervention:  n/a  Summary of Progress/Problems: Pt did not attend group.  Beryl MeagerIngle, Elleah Hemsley T 06/09/2014, 2:34 PM
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
## 2014-06-09 NOTE — Progress Notes (Signed)
Patient is alert and oriented x 4 today.  Affect restricted but pt has good eye contact and smiles when engaged. She reports feeling "very sleepy from the ativan" today.  She has been medication compliant and attended the recreation group.  Denies suicidal or homicidal thoughts. Will cont to monitor for safety.

## 2014-06-09 NOTE — Progress Notes (Signed)
Recreation Therapy Notes  Date: 05.11.16 Time: 3:00 pm Location: Craft Room  Group Topic: Self-esteem  Goal Area(s) Addresses:  Patient will be able to identify benefit of self-esteem. Patient will be able to identify ways to increase self-esteem.  Behavioral Response: Attentive, Interactive  Intervention: Self Portrait  Activity: Patients were instructed to draw a self-portrait. Afterwards, patients were instructed to write their name on a piece of paper and something positive about themselves. Patients wrote positive traits about peers. Then patients drew their self-portraits after reading the positive traits peers wrote.  Education: LRT educated patient on self-esteem and ways to increase their self-esteem.   Education Outcome: Acknowledges education/In group clarification offered   Clinical Observations/Feedback: Patient drew both self-portraits. Patient wrote a positive trait about self and peers. Patient contributed to group discussion by stating how her faces were different, how she felt after reading the positive traits her peers wrote, that it was easier to write positive traits about peers instead of herself, and how she can increase her self-esteem.   Jacquelynn CreeGreene,Nabil Bubolz M, LRT/CTRS 06/09/2014 4:17 PM

## 2014-06-09 NOTE — [facility] Suicide Risk Assessment (Signed)
[facility]   Nursing information obtained from:    Demographic factors:    Current Mental Status:    Loss Factors:    Historical Factors:    Risk Reduction Factors:    Total Time spent with patient: 1 hour Principal Problem: Major depressive disorder, recurrent episode, severe Diagnosis:   Patient Active Problem List   Diagnosis Date Noted  . Major depressive disorder, recurrent episode, severe [F33.2] 06/09/2014  . PTSD (post-traumatic stress disorder) [F43.10] 06/09/2014  . Cannabis use disorder, severe, dependence [F12.20] 06/09/2014  . Stimulant use disorder (cocaine) moderate [F15.99] 06/09/2014  . Tobacco use disorder [Z72.0] 06/09/2014  . Cluster B personality disorder [F60.9] 06/09/2014     Continued Clinical Symptoms:  Alcohol Use Disorder Identification Test Final Score (AUDIT): 0 The "Alcohol Use Disorders Identification Test", Guidelines for Use in Primary Care, Second Edition.  World Science writerHealth Organization [facility](WHO). Score between 0-7:  no or low risk or alcohol related problems. Score between 8-15:  moderate risk of alcohol related problems. Score between 16-19:  high risk of alcohol related problems. Score 20 or above:  warrants further diagnostic evaluation for alcohol dependence and treatment.   CLINICAL FACTORS:   Severe Anxiety and/or Agitation Depression:   Aggression Comorbid alcohol abuse/dependence Hopelessness Impulsivity Severe Alcohol/Substance Abuse/Dependencies Personality Disorders:   Cluster B Comorbid alcohol abuse/dependence Previous Psychiatric Diagnoses and Treatments   Musculoskeletal: Strength & Muscle Tone: within normal limits Gait & Station: normal Patient leans: N/A  Psychiatric Specialty Exam: Physical Exam  Review of Systems  Constitutional: Negative.   HENT: Negative.   Eyes: Negative.   Respiratory: Negative.   Cardiovascular: Negative.   Gastrointestinal: Negative.   Genitourinary: Negative.   Negative for flank pain.  Musculoskeletal: Negative.   Skin: Negative.   Neurological: Negative.   Endo/Heme/Allergies: Negative.   Psychiatric/Behavioral: Positive for depression, hallucinations and substance abuse. Negative for suicidal ideas. The patient is nervous/anxious. The patient does not have insomnia.     Blood pressure 153/101, pulse 81, temperature 98.4 F (36.9 C), temperature source Oral, resp. rate 20, height 5\' 3"  (1.6 m), weight 96.163 kg (212 lb), last menstrual period 06/05/2014, SpO2 99 %, unknown if currently breastfeeding.Body mass index is 37.56 kg/(m^2).                                                         COGNITIVE FEATURES THAT CONTRIBUTE TO RISK:  Polarized thinking    SUICIDE RISK:   Moderate:  Frequent suicidal ideation with limited intensity, and duration, some specificity in terms of plans, no associated intent, good self-control, limited dysphoria/symptomatology, some risk factors present, and identifiable protective factors, including available and accessible social support.  [facility]  Medical Decision Making:  Established Problem, Stable/Improving (1)  I certify that inpatient services furnished can reasonably be expected to improve the patient's condition.   [name],  Kourtlynn Trevor 06/09/2014, 5:14 PM

## 2014-06-10 DIAGNOSIS — F332 Major depressive disorder, recurrent severe without psychotic features: Secondary | ICD-10-CM | POA: Diagnosis present

## 2014-06-10 LAB — TSH: TSH: 1.447 u[IU]/mL (ref 0.350–4.500)

## 2014-06-10 LAB — VITAMIN B12: VITAMIN B 12: 406 pg/mL (ref 180–914)

## 2014-06-10 MED ORDER — NICOTINE 10 MG IN INHA: 1.0000 | RESPIRATORY_TRACT | Status: DC | PRN

## 2014-06-10 MED ORDER — CEPHALEXIN 500 MG PO CAPS: 500.0000 mg | ORAL_CAPSULE | Freq: Three times a day (TID) | ORAL | Status: DC

## 2014-06-10 MED ORDER — FLUOXETINE HCL 20 MG PO CAPS: 20.0000 mg | ORAL_CAPSULE | Freq: Every day | ORAL | Status: DC

## 2014-06-10 MED ORDER — AMLODIPINE BESYLATE 5 MG PO TABS
2.5000 mg | ORAL_TABLET | Freq: Every day | ORAL | Status: DC
  Administered 2014-06-10 – 2014-06-11 (×2): 2.5 mg via ORAL
  Filled 2014-06-10 (×2): qty 1

## 2014-06-10 MED ORDER — TRAZODONE HCL 100 MG PO TABS: 100.0000 mg | ORAL_TABLET | Freq: Every evening | ORAL | Status: DC | PRN

## 2014-06-10 MED ORDER — HALOPERIDOL 0.5 MG PO TABS: 0.5000 mg | ORAL_TABLET | Freq: Three times a day (TID) | ORAL | Status: DC

## 2014-06-10 MED FILL — Nicotine Inhaler System 10 MG (4 MG Delivered): RESPIRATORY_TRACT | Qty: 36 | Status: AC

## 2014-06-10 NOTE — B[facility] Group Notes (Signed)
[facility] Group Notes:  (Nursing/MHT/Case Management/Adjunct)  Date:  06/10/2014  Time:  1:29 AM  Type of Therapy:  Group Therapy  Participation Level:  Active  Participation Quality:  Appropriate  Affect:  Appropriate  Cognitive:  Alert  Insight:  Improving  Engagement in Group:  Improving  Modes of Intervention:  Wrap-up Group  Summary of Progress/Problems:  [employer]VR IncChelsea Morrison Ellen Morrison 06/10/2014, 1:29 AM

## 2014-06-10 NOTE — Plan of Care (Signed)
[facility] The[facility] Goal: STG-Patient will demonstrate improved self esteem by identif STG: Self-Esteem - Within 5 treatment sessions, patient will verbalize at least 5 positive affirmation statements in each of 4 treatment sessions to increase self-esteem post d/c.  Attending unit programming  Verbalizing feelings  Goal: STG-Patient will identify at least five coping skills for ** STG: Coping Skills - Within 3 treatment sessions, patient will verbalize at least 5 coping skills for substance abuse in each of 2 treatment sessions to decrease substance abuse post d/c.  Crisis intervention

## 2014-06-10 NOTE — Progress Notes (Signed)
Pt is anxious. States she's ready to go home. endorses auditory hallucinations, states the voices are calmer. Pt OOB to DR, watching tv. Interacting with staff and peers. Med compliant. No voiced thoughts of hurting herself. No behavior problems noted. No c/o pain/discomfort noted.

## 2014-06-10 NOTE — Plan of Care (Signed)
Problem: [facility] Participation in Recreation Therapeutic Interventions Goal: STG-Patient will demonstrate improved self esteem by identif STG: Self-Esteem - Within 4 treatment sessions, patient will verbalize at least 5 positive affirmation statements in each of 2 treatment sessions to increase self-esteem post d/c.  Outcome: Progressing Treatment Session 1; Completed 1 out 2: At approximately 3:55 pm, LRT met with patient in patient room. Patient verbalized 5 positive affirmation statements. Patient reported it felt "pretty good". LRT encouraged patient to continue saying positive affirmation statements.  Leonette Monarch, LRT/CTRS 05.12.16 5:33 pm Goal: STG-Patient will identify at least five coping skills for ** STG: Coping Skills - Within 3 treatment sessions, patient will verbalize at least 5 coping skills for substance abuse in each of 2 treatment sessions to decrease substance abuse post d/c.  Outcome: Progressing Treatment Session 1; Completed 1 out of 2: At approximately 3:55 pm, LRT met with patient in patient room. Patient verbalized 5 coping skills for substance abuse. LRT educated patient on leisure and why it is important to implement it into her schedule. LRT provided patient with blank schedules to help her plan her day and try to avoid using substances. LRT educated patient on healthy support systems are why they are important. Patient reported treatment session was helpful.  Leonette Monarch, LRT/CTRS 05.12.16 5:36 pm Goal: STG-Other Recreation Therapy Goal (Specify) STG: Stress Management - Within 5 treatment sessions, patient will demonstrate at least one stress management technique in one treatment session to increase stress management skills post d/c.  Outcome: Progressing Treatment Session 1; Completed 0 out of 1: At approximately 3:55 pm, LRT met with patient in patient room. LRT educated and provided patient with handouts on stress management techniques. Patient verbalized  understanding. LRT encouraged patient to continue saying positive affirmation statements.  Leonette Monarch, LRT/CTRS 05.12.16 5:37 pm  Problem: [facility] Participation in Recreation Therapeutic Interventions Goal: STG-Patient will identify at least five coping skills for ** STG: Coping Skills - Within 3 treatment sessions, patient will verbalize at least 5 coping skills for anger in each of 2 treatment sessions to increase anger management skills post d/c.  Outcome: Progressing Treatment Session 1; Completed 1 out of 2: At approximately 3:55 pm, LRT met with patient in patient room. Patient verbalized 5 coping skills for anger. Patient verbalized what triggers her to get angry, how her body responds to anger, and how she will remember to use her healthy coping skills. LRT provided suggestions as well. 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 Patient reported treatment session was helpful.  Leonette Monarch, LRT/CTRS 05.12.16 5:39 pm

## 2014-06-10 NOTE — Plan of Care (Signed)
Problem: Ineffective individual coping Goal: STG: Patient will remain free from self harm Outcome: Not Met (add Reason) Pt calm and cooperative. Med compliant. No behavior problems noted.

## 2014-06-10 NOTE — Plan of Care (Signed)
Problem: Ineffective individual coping Goal: STG: Patient will remain free from self harm Outcome: Progressing Calm and cooperative. States she slept well last night. Med compliant. Pt participate in group appropriately. No voiced thoughts of hurting herself. q 15 min checks maintained for safety.

## 2014-06-10 NOTE — B[facility] Group Notes (Deleted)
[facility] Group Notes:  (Nursing/MHT/Case Management/Adjunct)  Date:  06/10/2014  Time:  1:31 AM  Type of Therapy:  Group Therapy  Participation Level:  Active  Participation Quality:  Appropriate  Affect:  Appropriate  Cognitive:  Alert  Insight:  Improving  Engagement in Group:  Engaged  Modes of Intervention:  Wrap-up Group  Summary of Progress/Problems:  [employer] IncChelsea Nanta Addilee Neu 06/10/2014, 1:31 AM

## 2014-06-10 NOTE — Progress Notes (Signed)
Recreation Therapy Notes  Date: 05.12.16 Time: 3:15 pm Location: Craft Room  Group Topic: Leisure Educaion  Goal Area(s) Addresses:  Patient ill identify activities for each letter of the alphabet. Patient will verbalize ability to integrate positive leisure into life post d/c. Patient will verbalize ability to use leisure as a coping mechanism.  Behavioral Response: Did not attend  Intervention: Leisure Alphabet  Activity: Patients were given a leisure alphabet worksheet and instructed to list healthy leisure activities for each letter of the alphabet.  Education: LRT educated patients on leisure and why it is important to use it as a Associate Professorcoping skill.  Education Outcome: Did not attend  Clinical Observations/Feedback: Patient did not attend group.   Jacquelynn CreeGreene,Shaley Leavens M, LRT/CTRS 06/10/2014 4:29 PM

## 2014-06-10 NOTE — Progress Notes (Signed)
Patient remained isolated in room most of shift. Appetite good at meals  . Noted on phone talking with friends . Aware of going  Home tomorrow and follow up  With [facility]. Appetite good .Voice no concerns around sleep. Limited interaction with peers . Instruction given on medication , able to verbalize information received.L;imited participation with unit activities.
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
## 2014-06-10 NOTE — Progress Notes (Signed)
[facility]  06/10/2014 12:26 PM Ellen Morrison  MRN:  [id number] Subjective:  Patient reports feeling much better this morning. Feels that Haldol has been very helpful. She had some difficulties falling asleep last night and that other than that she has no other complaints. Denies having major issues with appetite energy or concentration.  Denies suicidality homicidality or auditory or visual hallucinations. Denies side effects from her medications. Denies any physical complaints.  Per nursing:Pt is anxious. States she's ready to go home. endorses auditory hallucinations, states the voices are calmer. Pt OOB to DR, watching tv. Interacting with staff and peers. Med compliant. No voiced thoughts of hurting herself. No behavior problems noted. No c/o pain/discomfort noted.   Principal Problem: Major depressive disorder, recurrent episode, severe Diagnosis:   Patient Active Problem List   Diagnosis Date Noted  . Major depressive disorder, recurrent episode, severe [F33.2] 06/09/2014  . PTSD (post-traumatic stress disorder) [F43.10] 06/09/2014  . Cannabis use disorder, severe, dependence [F12.20] 06/09/2014  . Stimulant use disorder (cocaine) moderate [F15.99] 06/09/2014  . Tobacco use disorder [Z72.0] 06/09/2014  . Cluster B personality disorder [F60.9] 06/09/2014   Total Time spent with patient: 30 minutes   Past Medical History:  Past Medical History  Diagnosis Date  . Preeclampsia 7 years ago     with first pregnancy  . Bipolar disease, manic     Past Surgical History  Procedure Laterality Date  . Cesarean section  7 years ago   Family History:  Family History  Problem Relation Age of Onset  . Other Neg Hx   . Schizophrenia Mother    Social History:  History  Alcohol Use No     History  Drug Use  . Yes  . Special: Marijuana    History   Social History  . Marital Status: Single    Spouse Name: N/A  . Number of Children: N/A  . Years of Education: N/A    Social History Main Topics  . Smoking status: Current Every Day Smoker -- 1.00 packs/day    Types: Cigarettes  . Smokeless tobacco: Never Used  . Alcohol Use: No  . Drug Use: Yes    Special: Marijuana  . Sexual Activity: Yes    Birth Control/ Protection: Other-see comments     Comment: tubal ligation   Other Topics Concern  . None   Social History Narrative   Additional History:    Sleep: Good  Appetite:  Good   Assessment:   Musculoskeletal: Strength & Muscle Tone: within normal limits Gait & Station: normal Patient leans: N/A   Psychiatric Specialty Exam: Physical Exam  Review of Systems  Respiratory: Negative for cough.   Cardiovascular: Negative for chest pain.  Gastrointestinal: Negative for nausea, vomiting, abdominal pain, diarrhea and constipation.  Musculoskeletal: Negative for back pain.  Neurological: Negative for dizziness and headaches.  Psychiatric/Behavioral: Positive for depression and substance abuse. Negative for suicidal ideas and hallucinations. The patient is not nervous/anxious and does not have insomnia.     Blood pressure 148/99, pulse 80, temperature 98.2 F (36.8 C), temperature source Oral, resp. rate 20, height 5\' 3"  (1.6 m), weight 96.163 kg (212 lb), last menstrual period 06/05/2014, SpO2 99 %, unknown if currently breastfeeding.Body mass index is 37.56 kg/(m^2).  General Appearance: Fairly Groomed  Patent attorneyye Contact::  Good  Speech:  Normal Rate  Volume:  Normal  Mood:  Euthymic  Affect:  Congruent  Thought Process:  Linear and Logical  Orientation:  Full (Time,  Place, and Person)  Thought Content:  Hallucinations: None  Suicidal Thoughts:  No  Homicidal Thoughts:  No  Memory:  Immediate;   Good Recent;   Good Remote;   Good  Judgement:  Fair  Insight:  Fair  Psychomotor Activity:  Normal  Concentration:  Good  Recall:  NA  Fund of Knowledge:Good  Language: Good  Akathisia:  No  Handed:    AIMS (if indicated):     Assets:   Engineer, maintenanceCommunication Skills Housing Intimacy Physical Health Transportation  ADL's:  Intact  Cognition: WNL  Sleep:  Number of Hours: 5.25     Current Medications: Current Facility-Administered Medications  Medication Dose Route Frequency Provider Last Rate Last Dose  . acetaminophen (TYLENOL) tablet 650 mg  650 mg Oral Q6H PRN Audery AmelJohn T Clapacs, MD      . alum & mag hydroxide-simeth (MAALOX/MYLANTA) 200-200-20 MG/5ML suspension 30 mL  30 mL Oral Q4H PRN Audery AmelJohn T Clapacs, MD      . cephALEXin (KEFLEX) capsule 500 mg  500 mg Oral 3 times per day Audery AmelJohn T Clapacs, MD   500 mg at 06/10/14 0643  . FLUoxetine (PROZAC) capsule 20 mg  20 mg Oral Daily Jimmy FootmanAndrea Hernandez-Gonzalez, MD   20 mg at 06/10/14 0948  . haloperidol (HALDOL) tablet 0.5 mg  0.5 mg Oral TID Jimmy FootmanAndrea Hernandez-Gonzalez, MD   0.5 mg at 06/10/14 0949  . magnesium hydroxide (MILK OF MAGNESIA) suspension 30 mL  30 mL Oral Daily PRN Audery AmelJohn T Clapacs, MD      . nicotine (NICOTROL) 10 MG inhaler 1 continuous puffing  1 continuous puffing Inhalation PRN Jimmy FootmanAndrea Hernandez-Gonzalez, MD   1 continuous puffing at 06/09/14 1811  . traZODone (DESYREL) tablet 100 mg  100 mg Oral QHS PRN Jimmy FootmanAndrea Hernandez-Gonzalez, MD        Lab Results:  Results for orders placed or performed during the hospital encounter of 06/08/14 (from the past 48 hour(s))  TSH     Status: None   Collection Time: 06/10/14  7:27 AM  Result Value Ref Range   TSH 1.447 0.350 - 4.500 uIU/mL    Physical Findings: AIMS: Facial and Oral Movements Muscles of Facial Expression: None, normal Lips and Perioral Area: None, normal Jaw: None, normal Tongue: None, normal,Extremity Movements Upper (arms, wrists, hands, fingers): None, normal Lower (legs, knees, ankles, toes): None, normal, Trunk Movements Neck, shoulders, hips: None, normal, Overall Severity Severity of abnormal movements (highest score from questions above): None, normal Incapacitation due to abnormal movements: None,  normal Patient's awareness of abnormal movements (rate only patient's report): No Awareness, Dental Status Current problems with teeth and/or dentures?: No Does patient usually wear dentures?: No  CIWA:    COWS:     Treatment Plan Summary: Daily contact with patient to assess and evaluate symptoms and progress in treatment and Medication management   MDD and PTSD: Continue fluoxetine 20 mg po q day  Psychosis: likely secondary to severe daily cannabis use and micropsychotic episodes due to personality d/o.  Mood instability (cluster B traits): Continue haldol 0.5 mg tid. Spoke with her the importance of seeing a therapist. Pt has poor coping, poor frustration tolerance and self defeating behaviors.  Insomnia: Continue trazodone 100 mg po qhs prn  Tobacco: Continue nicotrol inhaler prn  Cannabis and cocaine use; educated about the negative effects of these drugs in mood and overall health.  UTI: continue keflex for 5 more days  Discharge planning: consider CST vs Intensive outpt substance abuse.  Will plan  for discharge tomorrow  Labs: TSH within the normal limits. Vitamin B12 level is is still pending  Medical Decision Making:  Established Problem, Stable/Improving (1)     Jimmy Footman 06/10/2014, 12:26 PM

## 2014-06-10 NOTE — [facility] Group Notes (Signed)
[facility] Group Notes:  (Nursing/MHT/Case Management/Adjunct)  Date:  06/10/2014  Time:  12:06 PM  Type of Therapy:  Psychoeducational Skills  Participation Level:  Active  Participation Quality:  Appropriate, Attentive and Sharing  Affect:  Appropriate  Cognitive:  Alert and Appropriate  Insight:  Appropriate  Engagement in Group:  Engaged  Modes of Intervention:  Discussion and Education  Summary of Progress/Problems:  Ellen Morrison 06/10/2014, 12:06 PM

## 2014-06-11 DIAGNOSIS — I10 Essential (primary) hypertension: Secondary | ICD-10-CM | POA: Diagnosis present

## 2014-06-11 MED ORDER — TRAZODONE HCL 150 MG PO TABS: 150.0000 mg | ORAL_TABLET | Freq: Every evening | ORAL | Status: DC | PRN

## 2014-06-11 MED ORDER — TRAZODONE HCL 50 MG PO TABS
150.0000 mg | ORAL_TABLET | Freq: Once | ORAL | Status: AC
  Administered 2014-06-11: 150 mg via ORAL
  Filled 2014-06-11: qty 1

## 2014-06-11 MED ORDER — AMLODIPINE BESYLATE 2.5 MG PO TABS: 2.5000 mg | ORAL_TABLET | Freq: Every day | ORAL | Status: DC

## 2014-06-11 NOTE — Discharge Summary (Signed)
Physician Discharge Summary Note  Patient:  Ellen Morrison is an 34 y.o., female MRN:  [id number] DOB:  08-Oct-1980 Patient phone:  [phone] (home)  Patient address:   [address] [place] [address],  Total Time spent with patient: 30 minutes  Date of Admission:  06/08/2014 Date of Discharge: 06/11/2014  Reason for Admission:  suicidality and homicidality   Principal Problem: Major depressive disorder, recurrent episode, severe Discharge Diagnoses: Patient Active Problem List   Diagnosis Date Noted  . HTN (hypertension) [I10] 06/11/2014  . Major depressive disorder, recurrent, severe without psychotic features [F33.2]   . Major depressive disorder, recurrent episode, severe [F33.2] 06/09/2014  . PTSD (post-traumatic stress disorder) [F43.10] 06/09/2014  . Cannabis use disorder, severe, dependence [F12.20] 06/09/2014  . Stimulant use disorder (cocaine) moderate [F15.99] 06/09/2014  . Tobacco use disorder [Z72.0] 06/09/2014  . Cluster B personality disorder [F60.9] 06/09/2014    Musculoskeletal: Strength & Muscle Tone: within normal limits Gait & Station: normal Patient leans: N/A  Psychiatric Specialty Exam: Physical Exam  Review of Systems  Constitutional: Negative.   Eyes: Negative.   Respiratory: Negative for cough.   Cardiovascular: Negative for chest pain.  Gastrointestinal: Negative for nausea, vomiting, abdominal pain, diarrhea and constipation.  Genitourinary: Negative for dysuria.  Musculoskeletal: Negative for back pain.  Skin: Negative for rash.  Neurological: Negative.  Negative for headaches.  Endo/Heme/Allergies: Negative.   Psychiatric/Behavioral: Negative for depression, suicidal ideas, hallucinations and substance abuse. The patient has insomnia. The patient is not nervous/anxious.     Blood pressure 132/84, pulse 80, temperature 98.3 F (36.8 C), temperature source Oral, resp. rate 20, height '5\' 3"'  (1.6 m), weight 96.163 kg (212 lb), last  menstrual period 06/05/2014, SpO2 99 %, unknown if currently breastfeeding.Body mass index is 37.56 kg/(m^2).  General Appearance: Fairly Groomed  Engineer, water::  Good  Speech:  Normal Rate  Volume:  Normal  Mood:  Euthymic  Affect:  Congruent  Thought Process:  Linear  Orientation:  Full (Time, Place, and Person)  Thought Content:  Hallucinations: None  Suicidal Thoughts:  No  Homicidal Thoughts:  No  Memory:  Immediate;   Good Recent;   Good Remote;   Good  Judgement:  Fair  Insight:  Fair  Psychomotor Activity:  Normal  Concentration:  Good  Recall:  NA  Fund of Knowledge:Good  Language: Good  Akathisia:  No  Handed:    AIMS (if indicated):     Assets:  Communication Skills Desire for Improvement Housing Intimacy Physical Health Social Support Transportation  ADL's:  Intact  Cognition: WNL  Sleep:  Number of Hours: 4   Have you used any form of tobacco in the last 30 days? (Cigarettes, Smokeless Tobacco, Cigars, and/or Pipes): Yes  Has this patient used any form of tobacco in the last 30 days? (Cigarettes, Smokeless Tobacco, Cigars, and/or Pipes) Yes, A prescription for an FDA-approved tobacco cessation medication was offered at discharge and the patient refused  Level of Care:  OP  Hospital Course:   MDD and PTSD: Patient was started on fluoxetine 20 mg po q day  Psychosis: likely secondary to severe daily cannabis use and micropsychotic episodes due to personality d/o.  Mood instability (cluster B traits): Continue haldol 0.5 mg tid. Spoke with her the importance of seeing a therapist. Pt has poor coping, poor frustration tolerance and self defeating behaviors.  Insomnia: Continue trazodone 100 mg po qhs prn  Tobacco: Continue nicotrol inhaler prn  Cannabis and cocaine use; educated about  the negative effects of these drugs in mood and overall health.  UTI: continue keflex for 4 more days  Hypertension: Patient was found to have hypertension and was  started on low-dose Norvasc.  Discharge planning: We will refer to [facility] for community support team  Labs: TSH and B12 are within the normal limits  This hospitalization was uneventful. The patient did not have any need for seclusion, restraints or forced medications. There were no behavioral disturbances. On the day of the discharge the patient reported significant improvement. Reported improvement in mood and appetite energy and concentration. She denied hallucinations. Denied suicidality homicidality or having any side effects from her medications. She denied having any physical complaints.  Consults:  None  Significant Diagnostic Studies:  None  Discharge Vitals:   Blood pressure 132/84, pulse 80, temperature 98.3 F (36.8 C), temperature source Oral, resp. rate 20, height '5\' 3"'  (1.6 m), weight 96.163 kg (212 lb), last menstrual period 06/05/2014, SpO2 99 %, unknown if currently breastfeeding. Body mass index is 37.56 kg/(m^2). Lab Results:   Results for orders placed or performed during the hospital encounter of 06/08/14 (from the past 72 hour(s))  TSH     Status: None   Collection Time: 06/10/14  7:27 AM  Result Value Ref Range   TSH 1.447 0.350 - 4.500 uIU/mL  Vitamin B12     Status: None   Collection Time: 06/10/14  7:27 AM  Result Value Ref Range   Vitamin B-12 406 180 - 914 pg/mL    Comment: (NOTE) This assay is not validated for testing neonatal or myeloproliferative syndrome specimens for Vitamin B12 levels. Performed at [facility]    Results for Ellen Morrison, Ellen Morrison (MRN [id number]) as of 06/11/2014 13:25  Ref. Range 06/07/2014 22:40 06/07/2014 23:45 06/10/2014 07:27  Sodium Latest Ref Range: 135-145 mmol/L 139    Potassium Latest Ref Range: 3.5-5.1 mmol/L 3.9    Chloride Latest Ref Range: 101-111 mmol/L 105    CO2 Latest Ref Range: 22-32 mmol/L 27    BUN Latest Ref Range: 6-20 mg/dL 14    Creatinine Latest Ref Range: 0.44-1.00 mg/dL 0.94    Calcium Latest Ref  Range: 8.9-10.3 mg/dL 8.9    EGFR (Non-African Amer.) Latest Ref Range: >60 mL/min >60    EGFR (African American) Latest Ref Range: >60 mL/min >60    Glucose Latest Ref Range: 65-99 mg/dL 103 (H)    Anion gap Latest Ref Range: 5-15  7    Alkaline Phosphatase Latest Ref Range: 38-126 U/L 60    Albumin Latest Ref Range: 3.5-5.0 g/dL 3.7    AST Latest Ref Range: 15-41 U/L 16    ALT Latest Ref Range: 14-54 U/L 11 (L)    Total Protein Latest Ref Range: 6.5-8.1 g/dL 6.9    Total Bilirubin Latest Ref Range: 0.3-1.2 mg/dL 0.2 (L)    Vitamin B-12 Latest Ref Range: 180-914 pg/mL   406  WBC Latest Ref Range: 3.6-11.0 K/uL 8.7    RBC Latest Ref Range: 3.80-5.20 MIL/uL 4.02    Hemoglobin Latest Ref Range: 12.0-16.0 g/dL 11.4 (L)    HCT Latest Ref Range: 35.0-47.0 % 35.2    MCV Latest Ref Range: 80.0-100.0 fL 87.6    MCH Latest Ref Range: 26.0-34.0 pg 28.3    MCHC Latest Ref Range: 32.0-36.0 g/dL 32.3    RDW Latest Ref Range: 11.5-14.5 % 15.9 (H)    Platelets Latest Ref Range: 150-440 K/uL 306    WBC Clumps Unknown  PRESENT  Salicylate Lvl Latest Ref Range: 2.8-30.0 mg/dL <4.0    Acetaminophen (Tylenol), S Latest Ref Range: 10-30 ug/mL <10 (L)    TSH Latest Ref Range: 0.350-4.500 uIU/mL   1.447  Appearance Latest Ref Range: CLEAR   CLOUDY (A)   Bacteria, UA Latest Ref Range: NONE SEEN   NONE SEEN   Bilirubin Urine Latest Ref Range: NEGATIVE   NEGATIVE   Color, Urine Latest Ref Range: YELLOW   YELLOW (A)   Glucose Latest Ref Range: NEGATIVE mg/dL  NEGATIVE   Hgb urine dipstick Latest Ref Range: NEGATIVE   3+ (A)   Ketones, ur Latest Ref Range: NEGATIVE mg/dL  NEGATIVE   Leukocytes, UA Latest Ref Range: NEGATIVE   3+ (A)   Nitrite Latest Ref Range: NEGATIVE   NEGATIVE   pH Latest Ref Range: 5.0-8.0   8.0   Protein Latest Ref Range: NEGATIVE mg/dL  30 (A)   RBC / HPF Latest Ref Range: 0-5 RBC/hpf  TOO NUMEROUS TO C...   Specific Gravity, Urine Latest Ref Range: 1.005-1.030   1.018   Squamous  Epithelial / LPF Latest Ref Range: NONE SEEN   0-5 (A)   WBC, UA Latest Ref Range: 0-5 WBC/hpf  TOO NUMEROUS TO C...   Alcohol, Ethyl (B) Latest Ref Range: <5 mg/dL <5    Amphetamines, Ur Screen Latest Ref Range: NONE DETECTED   NONE DETECTED   Barbiturates, Ur Screen Latest Ref Range: NONE DETECTED   NONE DETECTED   Benzodiazepine, Ur Scrn Latest Ref Range: NONE DETECTED   NONE DETECTED   Cocaine Metabolite,Ur [place] Latest Ref Range: NONE DETECTED   POSITIVE (A)   Methadone Scn, Ur Latest Ref Range: NONE DETECTED   NONE DETECTED   MDMA (Ecstasy)Ur Screen Latest Ref Range: NONE DETECTED   NONE DETECTED   Cannabinoid 50 Ng, Ur [place] Latest Ref Range: NONE DETECTED   POSITIVE (A)   Opiate, Ur Screen Latest Ref Range: NONE DETECTED   NONE DETECTED   Phencyclidine (PCP) Ur S Latest Ref Range: NONE DETECTED   NONE DETECTED   Tricyclic, Ur Screen Latest Ref Range: NONE DETECTED   NONE DETECTED     Physical Findings: AIMS: Facial and Oral Movements Muscles of Facial Expression: None, normal Lips and Perioral Area: None, normal Jaw: None, normal Tongue: None, normal,Extremity Movements Upper (arms, wrists, hands, fingers): None, normal Lower (legs, knees, ankles, toes): None, normal, Trunk Movements Neck, shoulders, hips: None, normal, Overall Severity Severity of abnormal movements (highest score from questions above): None, normal Incapacitation due to abnormal movements: None, normal Patient's awareness of abnormal movements (rate only patient's report): No Awareness, Dental Status Current problems with teeth and/or dentures?: No Does patient usually wear dentures?: No  CIWA:    COWS:      See Psychiatric Specialty Exam and Suicide Risk Assessment completed by Attending Physician prior to discharge.  Discharge destination:  Home  Is patient on multiple antipsychotic therapies at discharge:  No   Has Patient had three or more failed trials of antipsychotic monotherapy by history:   No    Recommended Plan for Multiple Antipsychotic Therapies: NA  Discharge Instructions    Diet - low sodium heart healthy    Complete by:  As directed             Medication List    STOP taking these medications        LATUDA 20 MG Tabs  Generic drug:  Lurasidone HCl      TAKE these  medications      Indication   amLODipine 2.5 MG tablet  Commonly known as:  NORVASC  Take 1 tablet (2.5 mg total) by mouth daily.  Notes to Patient:  HTN      cephALEXin 500 MG capsule  Commonly known as:  KEFLEX  Take 1 capsule (500 mg total) by mouth every 8 (eight) hours.  Notes to Patient:  UTI      FLUoxetine 20 MG capsule  Commonly known as:  PROZAC  Take 1 capsule (20 mg total) by mouth daily.  Notes to Patient:  Depression      haloperidol 0.5 MG tablet  Commonly known as:  HALDOL  Take 1 tablet (0.5 mg total) by mouth 3 (three) times daily.  Notes to Patient:  Anger and irritability      nicotine 10 MG inhaler  Commonly known as:  NICOTROL  Inhale 1 cartridge (1 continuous puffing total) into the lungs as needed for smoking cessation.  Notes to Patient:  Nicotine craving      traZODone 150 MG tablet  Commonly known as:  DESYREL  Take 1 tablet (150 mg total) by mouth at bedtime as needed for sleep.  Notes to Patient:  Insomnia            Follow-up Information    Follow up with [facility]. Go in 3 days.   Why:  Hospital Discharge Follow up- Walk in clinic hours are 8-3 M-F. Be sure to tell staff you are a hospital discharge followup to reduce wait time   Contact information:   [address] [place], [place] Ph [phone] Fax [phone]      Follow-up recommendations:  Other:  Follow-up with more knack for CST  Comments:  None  Total Discharge Time: 30 minutes  Signed: Hildred Priest 06/11/2014, 1:22 PM

## 2014-06-11 NOTE — [facility] Suicide Risk Assessment (Deleted)
[facility]   Nursing information obtained from:    Demographic factors:    Current Mental Status:    Loss Factors:    Historical Factors:    Risk Reduction Factors:    Total Time spent with patient: 30 minutes Principal Problem: Major depressive disorder, recurrent episode, severe Diagnosis:   Patient Active Problem List   Diagnosis Date Noted  . HTN (hypertension) [I10] 06/11/2014  . Major depressive disorder, recurrent, severe without psychotic features [F33.2]   . Major depressive disorder, recurrent episode, severe [F33.2] 06/09/2014  . PTSD (post-traumatic stress disorder) [F43.10] 06/09/2014  . Cannabis use disorder, severe, dependence [F12.20] 06/09/2014  . Stimulant use disorder (cocaine) moderate [F15.99] 06/09/2014  . Tobacco use disorder [Z72.0] 06/09/2014  . Cluster B personality disorder [F60.9] 06/09/2014     Continued Clinical Symptoms:  Alcohol Use Disorder Identification Test Final Score (AUDIT): 0 The "Alcohol Use Disorders Identification Test", Guidelines for Use in Primary Care, Second Edition.  World Science writerHealth Organization [facility](WHO). Score between 0-7:  no or low risk or alcohol related problems. Score between 8-15:  moderate risk of alcohol related problems. Score between 16-19:  high risk of alcohol related problems. Score 20 or above:  warrants further diagnostic evaluation for alcohol dependence and treatment.   CLINICAL FACTORS:   Depression:   Comorbid alcohol abuse/dependence Alcohol/Substance Abuse/Dependencies Personality Disorders:   Cluster B Previous Psychiatric Diagnoses and Treatments   Musculoskeletal: Strength & Muscle Tone: within normal limits Gait & Station: normal Patient leans: N/A  Psychiatric Specialty Exam: Physical Exam  ROS  Blood pressure 132/84, pulse 80, temperature 98.3 F (36.8 C), temperature source Oral, resp. rate 20, height 5\' 3"  (1.6 m), weight 96.163 kg (212 lb), last menstrual period  06/05/2014, SpO2 99 %, unknown if currently breastfeeding.Body mass index is 37.56 kg/(m^2).                                                         COGNITIVE FEATURES THAT CONTRIBUTE TO RISK:  None    SUICIDE RISK:   Minimal: No identifiable suicidal ideation.  Patients presenting with no risk factors but with morbid ruminations; may be classified as minimal risk based on the severity of the depressive symptoms  PLAN OF CARE: [facility] home [facility]  Medical Decision Making:  Established Problem, Stable/Improving (1)  I certify that inpatient services furnished can reasonably be expected to improve the patient's condition.   [name],  Shary Lamos 06/11/2014, 9:03 AM

## 2014-06-11 NOTE — [facility] Suicide Risk Assessment (Signed)
[facility] INPATIENT:  Family/Significant Other Suicide Prevention Education  Suicide Prevention Education:  Contact Attempts: Jaywan Rankin (boyfriend) has been identified by the patient as the family member/significant other with whom the patient will be residing, and identified as the person(s) who will aid the patient in the event of a mental health crisis.  With written consent from the patient, two attempts were made to provide suicide prevention education, prior to and/or following the patient's discharge.  We were unsuccessful in providing suicide prevention education.  A suicide education pamphlet was given to the patient to share with family/significant other.  Date and time of first attempt:06/11/14 10:02am Date and time of second attempt:06/11/14 11:09am  Beryl Meagerngle, Carlas Vandyne T 06/11/2014, 11:11 AM

## 2014-06-11 NOTE — Progress Notes (Signed)
  [facility]  Will you be returning to the same living situation after discharge:  Yes,  with boyfriend At discharge, do you have transportation home?: Yes,  boyfriend to pick up Do you have the ability to pay for your medications: Yes,  pt has [employer] Medicaid  Release of information consent forms completed and in the chart;  Patient's signature needed at discharge.  Patient to Follow up at: Follow-up Information    Follow up with [facility]. Go in 3 days.   Why:  Hospital Discharge Follow up- Walk in clinic hours are 8-3 M-F. Be sure to tell staff you are a hospital discharge followup to reduce wait time   Contact information:   [address] [place] Ph [phone] Fax [phone]      Patient denies SI/HI: Yes,  pr reports no SI/HI    Safety Planning and Suicide Prevention discussed: Yes,  pt received brochure for Suicide Prevention Educaiton and discussed brochure with pt and attempted to call family as well  Have you used any form of tobacco in the last 30 days? (Cigarettes, Smokeless Tobacco, Cigars, and/or Pipes): Yes  Has patient been referred to the Quitline?: Patient refused referral  Beryl Meagerngle, Larrell Rapozo T 06/11/2014, 3:37 PM

## 2014-06-11 NOTE — Progress Notes (Signed)
Recreation Therapy Notes  INPATIENT RECREATION TR PLAN  Patient Details Name: Ellen Morrison MRN: [id number] DOB: Jun 16, 1980 Today's Date: 06/11/2014  Rec Therapy Plan Is patient appropriate for Therapeutic Recreation?: Yes Treatment times per week: At least 3 times a week TR Treatment/Interventions: 1:1 session, Group participation (Comment) (Appropriate participation in daily recreation therapy tx)  Discharge Criteria Pt will be discharged from therapy if:: Discharged Treatment plan/goals/alternatives discussed and agreed upon by:: Patient/family  Discharge Summary Short term goals set: See Care Plan Short term goals met: Complete, Adequate for discharge Progress toward goals comments: One-to-one attended Which groups?: Self-esteem One-to-one attended: Self-esteem, stress management, coping skills Reason goals not met: Patient planning to d/c before all goals could be met Therapeutic equipment acquired: None Reason patient discharged from therapy: Discharge from hospital Pt/family agrees with progress & goals achieved: Yes Date patient discharged from therapy: 06/11/14   [facility], LRT/CTRS 06/11/2014, 12:14 PM

## 2014-06-11 NOTE — Plan of Care (Signed)
Problem: [facility] Participation in Recreation Therapeutic Interventions Goal: STG-Patient will demonstrate improved self esteem by identif STG: Self-Esteem - Within 4 treatment sessions, patient will verbalize at least 5 positive affirmation statements in each of 2 treatment sessions to increase self-esteem post d/c.  Outcome: Completed/Met Date Met:  06/11/14 Treatment Session 2; Completed 2 out of 2: At approximately 11:25 am, LRT met with patient in patient room. Patient verbalized 5 positive affirmation statements. Patient reported it felt "very good". LRT encouraged patient to continue saying positive affirmation statements.  [facility], LRT/CTRS 05.13.16 12:06 pm Goal: STG-Patient will identify at least five coping skills for ** STG: Coping Skills - Within 3 treatment sessions, patient will verbalize at least 5 coping skills for substance abuse in each of 2 treatment sessions to decrease substance abuse post d/c.  Outcome: Completed/Met Date Met:  06/11/14 Treatment Session 2; Completed 2 out of 2: At approximately 11:25 am, LRT met with patient in patient room. Patient verbalized 5 coping skills for substance abuse. LRT encouraged patient to use coping skills.  [facility], LRT/CTRS 05.13.16 12:08 pm Goal: STG-Other Recreation Therapy Goal (Specify) STG: Stress Management - Within 5 treatment sessions, patient will demonstrate at least one stress management technique in one treatment session to increase stress management skills post d/c.  Outcome: Adequate for Discharge Treatment Session 2; Completed 0 out of 1: At approximately 11:25 am, LRT met with patient in patient room. Patient reported she read over some of the stress management techniques, but did not practice them because she was not stressed. LRT encouraged patient to continue reading and to practice the stress management techniques.  [facility], LRT/CTRS 05.13.16 12:10 pm  Problem: [facility] Participation in  Recreation Therapeutic Interventions Goal: STG-Patient will identify at least five coping skills for ** STG: Coping Skills - Within 3 treatment sessions, patient will verbalize at least 5 coping skills for anger in each of 2 treatment sessions to increase anger management skills post d/c.  Outcome: Completed/Met Date Met:  06/11/14 Treatment Session 2; Completed 2 out of 2: At approximately 11:25 am, LRT met with patient in patient room. Patient verbalized 5 coping skills for anger. LRT encouraged patient to use the coping skills.  [facility], LRT/CTRS 05.13.16 12:11 pm

## 2014-06-11 NOTE — [facility] Group Notes (Signed)
[facility] Group Notes:  (Nursing/MHT/Case Management/Adjunct)  Date:  06/11/2014  Time:  12:49 PM  Type of Therapy:  Psychoeducational Skills  Participation Level:  Active  Participation Quality:  Appropriate, Attentive and Sharing  Affect:  Appropriate  Cognitive:  Appropriate  Insight:  Appropriate  Engagement in Group:  Engaged  Modes of Intervention:  Discussion and Education  Summary of Progress/Problems:  Ellen SmokeCara Morrison [facility] 06/11/2014, 12:49 PM

## 2014-06-11 NOTE — Progress Notes (Signed)
Pt remains irritable and guarded. Attends group. Med compliant. Trazodone 100 mg PO given as ordered PRN. Denies SI/HI. No AV/H noted. No  Behavior problems noted.  No c/o pain/discomfort noted.
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
## 2014-10-06 ENCOUNTER — Encounter (HOSPITAL_COMMUNITY): Payer: Self-pay | Admitting: *Deleted

## 2014-10-06 ENCOUNTER — Emergency Department (HOSPITAL_COMMUNITY)
Admission: EM | Admit: 2014-10-06 | Discharge: 2014-10-06 | Disposition: A | Discharge status: Home / Self Care | Payer: No Typology Code available for payment source | Attending: Emergency Medicine | Admitting: Emergency Medicine

## 2014-10-06 DIAGNOSIS — Z880 Allergy status to penicillin: Secondary | ICD-10-CM | POA: Diagnosis not present

## 2014-10-06 DIAGNOSIS — F319 Bipolar disorder, unspecified: Secondary | ICD-10-CM | POA: Insufficient documentation

## 2014-10-06 DIAGNOSIS — S161XXA Strain of muscle, fascia and tendon at neck level, initial encounter: Secondary | ICD-10-CM | POA: Insufficient documentation

## 2014-10-06 DIAGNOSIS — Y998 Other external cause status: Secondary | ICD-10-CM | POA: Diagnosis not present

## 2014-10-06 DIAGNOSIS — Y92410 Unspecified street and highway as the place of occurrence of the external cause: Secondary | ICD-10-CM | POA: Insufficient documentation

## 2014-10-06 DIAGNOSIS — Z720 Tobacco use: Secondary | ICD-10-CM | POA: Insufficient documentation

## 2014-10-06 DIAGNOSIS — Y9389 Activity, other specified: Secondary | ICD-10-CM | POA: Insufficient documentation

## 2014-10-06 DIAGNOSIS — T148XXA Other injury of unspecified body region, initial encounter: Secondary | ICD-10-CM

## 2014-10-06 DIAGNOSIS — S199XXA Unspecified injury of neck, initial encounter: Secondary | ICD-10-CM | POA: Diagnosis present

## 2014-10-06 MED ORDER — METHOCARBAMOL 500 MG PO TABS: 1000.0000 mg | ORAL_TABLET | Freq: Two times a day (BID) | ORAL | Status: DC

## 2014-10-06 MED ORDER — IBUPROFEN 800 MG PO TABS: 800.0000 mg | ORAL_TABLET | Freq: Three times a day (TID) | ORAL | Status: DC
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
## 2014-10-14 ENCOUNTER — Emergency Department (HOSPITAL_COMMUNITY): Payer: Medicaid Other

## 2014-10-14 ENCOUNTER — Emergency Department (HOSPITAL_COMMUNITY)
Admission: EM | Admit: 2014-10-14 | Discharge: 2014-10-14 | Disposition: A | Discharge status: Home / Self Care | Payer: Medicaid Other | Attending: Emergency Medicine | Admitting: Emergency Medicine

## 2014-10-14 ENCOUNTER — Encounter (HOSPITAL_COMMUNITY): Payer: Self-pay

## 2014-10-14 DIAGNOSIS — Z792 Long term (current) use of antibiotics: Secondary | ICD-10-CM | POA: Insufficient documentation

## 2014-10-14 DIAGNOSIS — N730 Acute parametritis and pelvic cellulitis: Secondary | ICD-10-CM | POA: Insufficient documentation

## 2014-10-14 DIAGNOSIS — A5901 Trichomonal vulvovaginitis: Secondary | ICD-10-CM | POA: Diagnosis not present

## 2014-10-14 DIAGNOSIS — Z79899 Other long term (current) drug therapy: Secondary | ICD-10-CM | POA: Insufficient documentation

## 2014-10-14 DIAGNOSIS — B9689 Other specified bacterial agents as the cause of diseases classified elsewhere: Secondary | ICD-10-CM

## 2014-10-14 DIAGNOSIS — N760 Acute vaginitis: Secondary | ICD-10-CM | POA: Insufficient documentation

## 2014-10-14 DIAGNOSIS — Z880 Allergy status to penicillin: Secondary | ICD-10-CM | POA: Diagnosis not present

## 2014-10-14 DIAGNOSIS — Z8659 Personal history of other mental and behavioral disorders: Secondary | ICD-10-CM | POA: Insufficient documentation

## 2014-10-14 DIAGNOSIS — A599 Trichomoniasis, unspecified: Secondary | ICD-10-CM

## 2014-10-14 DIAGNOSIS — R109 Unspecified abdominal pain: Secondary | ICD-10-CM | POA: Diagnosis present

## 2014-10-14 DIAGNOSIS — Z3202 Encounter for pregnancy test, result negative: Secondary | ICD-10-CM | POA: Diagnosis not present

## 2014-10-14 DIAGNOSIS — Z720 Tobacco use: Secondary | ICD-10-CM | POA: Diagnosis not present

## 2014-10-14 LAB — URINALYSIS, ROUTINE W REFLEX MICROSCOPIC
BILIRUBIN URINE: NEGATIVE
Glucose, UA: NEGATIVE mg/dL
KETONES UR: NEGATIVE mg/dL
NITRITE: NEGATIVE
PROTEIN: 30 mg/dL — AB
SPECIFIC GRAVITY, URINE: 1.024 (ref 1.005–1.030)
UROBILINOGEN UA: 1 mg/dL (ref 0.0–1.0)
pH: 7 (ref 5.0–8.0)

## 2014-10-14 LAB — URINE MICROSCOPIC-ADD ON

## 2014-10-14 LAB — WET PREP, GENITAL: YEAST WET PREP: NONE SEEN

## 2014-10-14 LAB — I-STAT CREATININE, ED: CREATININE: 0.8 mg/dL (ref 0.44–1.00)

## 2014-10-14 LAB — POC URINE PREG, ED: PREG TEST UR: NEGATIVE

## 2014-10-14 MED ORDER — CEFTRIAXONE SODIUM 250 MG IJ SOLR
250.0000 mg | Freq: Once | INTRAMUSCULAR | Status: AC
  Administered 2014-10-14: 250 mg via INTRAMUSCULAR
  Filled 2014-10-14: qty 250

## 2014-10-14 MED ORDER — LIDOCAINE HCL (PF) 1 % IJ SOLN
5.0000 mL | Freq: Once | INTRAMUSCULAR | Status: AC
  Administered 2014-10-14: 5 mL
  Filled 2014-10-14: qty 5

## 2014-10-14 MED ORDER — MORPHINE SULFATE (PF) 4 MG/ML IV SOLN
4.0000 mg | Freq: Once | INTRAVENOUS | Status: AC
  Administered 2014-10-14: 4 mg via INTRAVENOUS
  Filled 2014-10-14: qty 1

## 2014-10-14 MED ORDER — METRONIDAZOLE 500 MG PO TABS
2000.0000 mg | ORAL_TABLET | Freq: Once | ORAL | Status: AC
Start: 2014-10-14 — End: 2014-10-14
  Administered 2014-10-14: 2000 mg via ORAL
  Filled 2014-10-14: qty 4

## 2014-10-14 MED ORDER — IOHEXOL 300 MG/ML  SOLN
100.0000 mL | Freq: Once | INTRAMUSCULAR | Status: AC | PRN
  Administered 2014-10-14: 100 mL via INTRAVENOUS

## 2014-10-14 MED ORDER — HYDROCODONE-ACETAMINOPHEN 5-325 MG PO TABS: 1.0000 | ORAL_TABLET | Freq: Four times a day (QID) | ORAL | Status: DC | PRN

## 2014-10-14 MED ORDER — AZITHROMYCIN 250 MG PO TABS
1000.0000 mg | ORAL_TABLET | Freq: Once | ORAL | Status: AC
  Administered 2014-10-14: 1000 mg via ORAL
  Filled 2014-10-14: qty 4

## 2014-10-14 MED ORDER — DOXYCYCLINE HYCLATE 100 MG PO CAPS: 100.0000 mg | ORAL_CAPSULE | Freq: Two times a day (BID) | ORAL | Status: DC

## 2014-10-14 MED ORDER — METRONIDAZOLE 500 MG PO TABS: 500.0000 mg | ORAL_TABLET | Freq: Two times a day (BID) | ORAL | Status: DC

## 2014-10-14 NOTE — ED Provider Notes (Signed)
CSN: [id number]     Arrival date & time 10/14/14  1620 History   First MD Initiated Contact with Patient 10/14/14 1840     Chief Complaint  Patient presents with  . Vaginal Itching  . Dysuria  . Bloated  . Abdominal Pain     (Consider location/radiation/quality/duration/timing/severity/associated sxs/prior Treatment) HPI   34 year old female with hx of bipolar presents with c/o vaginal discomfort.  Patient reports for the past 5 days she has had urinary discomfort. Report having mild burning urination with urinary urgency and frequency. She initially to some leftover penicillin for 3 days but states the symptom did not improve and she developed vaginal itching similar to prior yeast infection. Despite taking Monistat her symptoms persist. She is now complaining of abdominal bloating, achy low abdominal pain, subjective fever and vaginal discomfort. She denies having chest pain, difficulty breathing, back pain, vaginal bleeding, vaginal discharge, or rash. Patient report LMP August 25, has tubal ligation.  Denies pain with sexual activities.  Past Medical History  Diagnosis Date  . Preeclampsia 7 years ago     with first pregnancy  . Bipolar disease, manic    Past Surgical History  Procedure Laterality Date  . Cesarean section  7 years ago   Family History  Problem Relation Age of Onset  . Other Neg Hx   . Schizophrenia Mother    Social History  Substance Use Topics  . Smoking status: Current Every Day Smoker -- 1.00 packs/day    Types: Cigarettes  . Smokeless tobacco: Never Used  . Alcohol Use: No   OB History    Gravida Para Term Preterm AB TAB SAB Ectopic Multiple Living   Review of Systems  All other systems reviewed and are negative.     Allergies  Geodon and Penicillins  Home Medications   Prior to Admission medications   Medication Sig Start Date End Date Taking? Authorizing Provider  amLODipine (NORVASC) 2.5 MG tablet Take 1 tablet  (2.5 mg total) by mouth daily. 06/11/14  Yes Jimmy Footman, MD  FLUoxetine (PROZAC) 20 MG capsule Take 1 capsule (20 mg total) by mouth daily. 06/10/14  Yes Jimmy Footman, MD  ibuprofen (ADVIL,MOTRIN) 800 MG tablet Take 1 tablet (800 mg total) by mouth 3 (three) times daily. 10/06/14  Yes Shari Upstill, PA-C  methocarbamol (ROBAXIN) 500 MG tablet Take 2 tablets (1,000 mg total) by mouth 2 (two) times daily. 10/06/14  Yes Shari Upstill, PA-C  cephALEXin (KEFLEX) 500 MG capsule Take 1 capsule (500 mg total) by mouth every 8 (eight) hours. Patient not taking: Reported on 10/06/2014 06/10/14   Jimmy Footman, MD  haloperidol (HALDOL) 0.5 MG tablet Take 1 tablet (0.5 mg total) by mouth 3 (three) times daily. Patient not taking: Reported on 10/06/2014 06/10/14   Jimmy Footman, MD  nicotine (NICOTROL) 10 MG inhaler Inhale 1 cartridge (1 continuous puffing total) into the lungs as needed for smoking cessation. Patient not taking: Reported on 10/06/2014 06/10/14   Jimmy Footman, MD  traZODone (DESYREL) 150 MG tablet Take 1 tablet (150 mg total) by mouth at bedtime as needed for sleep. Patient not taking: Reported on 10/06/2014 06/11/14   Jimmy Footman, MD   BP 124/68 mmHg  Pulse 107  Temp(Src) 98.2 F (36.8 C) (Oral)  Resp 18  SpO2 100%  LMP 09/23/2014 Physical Exam  Constitutional: She appears well-developed and well-nourished. No distress.  Obese African-American female appears  to be in no acute discomfort, nontoxic  HENT:  Head: Atraumatic.  Eyes: Conjunctivae are normal.  Neck: Neck supple.  Cardiovascular: Normal rate and regular rhythm.   Pulmonary/Chest: Effort normal and breath sounds normal.  Abdominal: Soft. There is tenderness (Tenderness to left upper quadrant, and suprapubic on palpation without guarding or rebound tenderness.).  Genitourinary:  Chaperone present during exam. No inguinal lymphadenopathy or inguinal hernia noted.  Normal external genitalia with mild white vaginal discharge noted. Vaginal vault with moderate amount of curd-like white discharge and pain with speculum insertion. A body mass noted to the superior aspects of vaginal vault that is mildly tender and difficult to advance speculum deep enough to visualize cervical os.  No tenderness with bimanual examination however exam is limited due to large body habitus.   Neurological: She is alert.  Skin: No rash noted.  Psychiatric: She has a normal mood and affect.  Nursing note and vitals reviewed.   ED Course  Procedures (including critical care time)  Patient here with symptoms of dysuria and concern for yeast infection. Workup initiated.  7:56 PM Patient with recurrent urinary tract infection and yeast infection. Her urine shows signs of urinary tract infection which will benefit with antibiotic treatment. On pelvic examination patient has a body mass noted to the superior aspects of the vaginal vault that obstructs the view of cervical os. Plan to obtain abd/pelvis CT scan for further evaluation.    10:36 PM Urine shows large leukocytes and WBC too numerous to count. Finding concerning for urinary tract infection. Wet prep showed moderate amount of trichomonas concerning for Trichomonas infection. Moderate amount of clue cell also noted likely causing bacterial vaginosis. Patient abdominal and pelvis CT scan showing inflammatory process in the left lower quadrant abutting the small bowel loop which could suggest diverticulitis however this may also represents PID was recommended that pelvic ultrasound may be helpful for further evaluation. Due to the symptoms ongoing for the past 5 days I do not think this is ovarian torsion.  Furthermore abdominal exam and pelvic examination is fairly unremarkable. She is currently afebrile and vital signs stable. Patient will receive Rocephin and Zithromax along with Flagyl in the ED. She will be discharge with  doxycycline for 2 weeks as treatment for PID and UTI. She will avoid all sexual activities until symptoms completely resolved. Outpatient follow-up recommended. Return precautions discussed.  Labs Review Labs Reviewed  WET PREP, GENITAL - Abnormal; Notable for the following:    Trich, Wet Prep MODERATE (*)    Clue Cells Wet Prep HPF POC MODERATE (*)    WBC, Wet Prep HPF POC RARE (*)    All other components within normal limits  URINALYSIS, ROUTINE W REFLEX MICROSCOPIC (NOT AT [facility]) - Abnormal; Notable for the following:    APPearance TURBID (*)    Hgb urine dipstick SMALL (*)    Protein, ur 30 (*)    Leukocytes, UA LARGE (*)    All other components within normal limits  URINE MICROSCOPIC-ADD ON - Abnormal; Notable for the following:    Squamous Epithelial / LPF MANY (*)    Bacteria, UA MANY (*)    All other components within normal limits  RPR  HIV ANTIBODY (ROUTINE TESTING)  POC URINE PREG, ED  I-STAT CREATININE, ED  GC/CHLAMYDIA PROBE AMP ([place]) NOT AT [facility]    Imaging Review Ct Abdomen Pelvis W Contrast  10/14/2014   CLINICAL DATA:  Abdominal bloating with vaginal yeast infection and dysuria.  EXAM:  CT ABDOMEN AND PELVIS WITH CONTRAST  TECHNIQUE: Multidetector CT imaging of the abdomen and pelvis was performed using the standard protocol following bolus administration of intravenous contrast.  CONTRAST:  OMNIPAQUE IOHEXOL 300 MG/ML  SOLN  COMPARISON:  07/17/2013  FINDINGS: Lung bases are normal.  Abdominal images demonstrate a normal spleen, pancreas, gallbladder and adrenal glands. Liver demonstrates a tiny sub cm hypodensity over the posterior right lobe likely a cyst. Kidneys are normal. Ureters are normal. Appendix is normal.  Minimal calcified plaque of the aortic bifurcation.  Colon is within normal.  Small bowel is within normal.  Pelvic images demonstrate the uterus extending high in position. There is an inflammatory process in the left lower quadrant with  stranding of the mesentery adjacent several small bowel loops which abuts the left fundus of the uterus. There is a 1.3 cm rounded low-density structure abutting the inferior surface of a small bowel loop immediately above the left ovary. This may represent a small bowel diverticular with acute small-bowel diverticulitis. Alternatively, cannot exclude a inflammatory process related to the adjacent left fallopian tube or ovary such as pelvic inflammatory disease. There are a few nonspecific mildly prominent periaortic and iliac chain lymph nodes.  The left ovary is slightly enlarged and somewhat ill-defined. Right ovary is normal. There is a small amount of free fluid in the cul-de-sac. Bladder and rectum are normal.  IMPRESSION: Inflammatory process in the left lower quadrant abutting a small bowel loop as well as the left uterine fundus and just above the left ovary. There is inflammatory change within the mesentery which appears to be more centered around the small bowel loops with a 1.3 cm low-density rounded structure also abutting the inferior surface of 1 of these small bowel loops. This may be due to acute diverticulitis of small bowel. Alternatively this may represent an inflammatory gyn process such as pelvic inflammatory disease and less likely left ovarian torsion or torsion of serosal fibroid. Pelvic ultrasound may be helpful for further evaluation.  Sub cm liver hypodensity likely a cyst.   Electronically Signed   By: Elberta Fortis M.D.   On: 10/14/2014 22:28   I have personally reviewed and evaluated these images and lab results as part of my medical decision-making.   EKG Interpretation None      MDM   Final diagnoses:  PID (acute pelvic inflammatory disease)  BV (bacterial vaginosis)  Trichomoniasis    BP 141/73 mmHg  Pulse 100  Temp(Src) 98.9 F (37.2 C) (Oral)  Resp 15  SpO2 100%  LMP 09/23/2014     Fayrene Helper, PA-C 10/14/14 2305  Medical screening  examination/treatment/procedure(s) were conducted as a shared visit with non-physician practitioner(s) and myself.  I personally evaluated the patient during the encounter.   EKG Interpretation None     No acute abdomen. Pelvic exam per physician assistant. History and physical most consistent with pelvic inflammatory disease. CT scan reviewed. Patient understands to return if worse.  Donnetta Hutching, MD 10/17/14 [phone]

## 2014-10-14 NOTE — ED Notes (Signed)
Successful IV placement after two attempts however, not able to collect Istat creatinine.

## 2014-10-14 NOTE — ED Notes (Signed)
Phlebotomy to attempt 

## 2014-10-14 NOTE — ED Notes (Signed)
Patient c/o abdominal bloating, vaginal yeat infection, and dysuria. Patient states she has been using monistat with no relief.

## 2014-10-14 NOTE — Discharge Instructions (Signed)
Pelvic Inflammatory Disease Pelvic inflammatory disease (PID) is an infection in some or all of the female organs. PID can be in the uterus, ovaries, fallopian tubes, or the surrounding tissues inside the lower belly area (pelvis). HOME CARE   If given, take your antibiotic medicine as told. Finish them even if you start to feel better.  Only take medicine as told by your doctor.  Do not have sex (intercourse) until treatment is done or as told by your doctor.  Tell your sex partner if you have PID. Your partner may need to be treated.  Keep all doctor visits. GET HELP RIGHT AWAY IF:   You have a fever.  You have more belly (abdominal) or lower belly pain.  You have chills.  You have pain when you pee (urinate).  You are not better after 72 hours.  You have more fluid (discharge) coming from your vagina or fluid that is not normal.  You need pain medicine from your doctor.  You throw up (vomit).  You cannot take your medicines.  Your partner has a sexually transmitted disease (STD). MAKE SURE YOU:   Understand these instructions.  Will watch your condition.  Will get help right away if you are not doing well or get worse. Document Released: 04/13/2008 Document Revised: 05/12/2012 Document Reviewed: 01/11/2011 [facility] Patient Information 2015 [place], [place]. This information is not intended to replace advice given to you by your health care provider. Make sure you discuss any questions you have with your health care provider.  Bacterial Vaginosis Bacterial vaginosis is an infection of the vagina. It happens when too many of certain germs (bacteria) grow in the vagina. HOME CARE  Take your medicine as told by your doctor.  Finish your medicine even if you start to feel better.  Do not have sex until you finish your medicine and are better.  Tell your sex partner that you have an infection. They should see their doctor for treatment.  Practice safe sex. Use  condoms. Have only one sex partner. GET HELP IF:  You are not getting better after 3 days of treatment.  You have more grey fluid (discharge) coming from your vagina than before.  You have more pain than before.  You have a fever. MAKE SURE YOU:   Understand these instructions.  Will watch your condition.  Will get help right away if you are not doing well or get worse. Document Released: 10/25/2007 Document Revised: 11/05/2012 Document Reviewed: 08/27/2012 [facility] Patient Information 2015 [place], [place]. This information is not intended to replace advice given to you by your health care provider. Make sure you discuss any questions you have with your health care provider.  Trichomoniasis Trichomoniasis is an infection caused by an organism called Trichomonas. The infection can affect both women and men. In women, the outer female genitalia and the vagina are affected. In men, the penis is mainly affected, but the prostate and other reproductive organs can also be involved. Trichomoniasis is a sexually transmitted infection (STI) and is most often passed to another person through sexual contact.  RISK FACTORS  Having unprotected sexual intercourse.  Having sexual intercourse with an infected partner. SIGNS AND SYMPTOMS  Symptoms of trichomoniasis in women include:  Abnormal gray-green frothy vaginal discharge.  Itching and irritation of the vagina.  Itching and irritation of the area outside the vagina. Symptoms of trichomoniasis in men include:   Penile discharge with or without pain.  Pain during urination. This results from inflammation of the  urethra. DIAGNOSIS  Trichomoniasis may be found during a Pap test or physical exam. Your health care provider may use one of the following methods to help diagnose this infection:  Examining vaginal discharge under a microscope. For men, urethral discharge would be examined.  Testing the pH of the vagina with a test  tape.  Using a vaginal swab test that checks for the Trichomonas organism. A test is available that provides results within a few minutes.  Doing a culture test for the organism. This is not usually needed. TREATMENT   You may be given medicine to fight the infection. Women should inform their health care provider if they could be or are pregnant. Some medicines used to treat the infection should not be taken during pregnancy.  Your health care provider may recommend over-the-counter medicines or creams to decrease itching or irritation.  Your sexual partner will need to be treated if infected. HOME CARE INSTRUCTIONS   Take medicines only as directed by your health care provider.  Take over-the-counter medicine for itching or irritation as directed by your health care provider.  Do not have sexual intercourse while you have the infection.  Women should not douche or wear tampons while they have the infection.  Discuss your infection with your partner. Your partner may have gotten the infection from you, or you may have gotten it from your partner.  Have your sex partner get examined and treated if necessary.  Practice safe, informed, and protected sex.  See your health care provider for other STI testing. SEEK MEDICAL CARE IF:   You still have symptoms after you finish your medicine.  You develop abdominal pain.  You have pain when you urinate.  You have bleeding after sexual intercourse.  You develop a rash.  Your medicine makes you sick or makes you throw up (vomit). MAKE SURE YOU:  Understand these instructions.  Will watch your condition.  Will get help right away if you are not doing well or get worse. Document Released: 07/11/2000 Document Revised: 06/01/2013 Document Reviewed: 10/27/2012 [facility] Patient Information 2015 [place], [place]. This information is not intended to replace advice given to you by your health care provider. Make sure you discuss any  questions you have with your health care provider.

## 2014-10-15 LAB — GC/CHLAMYDIA PROBE AMP (~~LOC~~) NOT AT ARMC
Chlamydia: NEGATIVE
NEISSERIA GONORRHEA: NEGATIVE

## 2014-10-15 LAB — RPR: RPR Ser Ql: NONREACTIVE

## 2014-10-15 LAB — HIV ANTIBODY (ROUTINE TESTING W REFLEX): HIV SCREEN 4TH GENERATION: NONREACTIVE

## 2018-10-21 ENCOUNTER — Ambulatory Visit (LOCAL_COMMUNITY_HEALTH_CENTER): Payer: Self-pay | Admitting: Family Medicine

## 2018-10-21 ENCOUNTER — Other Ambulatory Visit: Payer: Self-pay

## 2018-10-21 ENCOUNTER — Encounter: Payer: Self-pay | Admitting: Family Medicine

## 2018-10-21 VITALS — BP 112/75 | Ht 63.0 in | Wt 173.8 lb

## 2018-10-21 DIAGNOSIS — N760 Acute vaginitis: Secondary | ICD-10-CM

## 2018-10-21 DIAGNOSIS — Z3009 Encounter for other general counseling and advice on contraception: Secondary | ICD-10-CM

## 2018-10-21 DIAGNOSIS — B9689 Other specified bacterial agents as the cause of diseases classified elsewhere: Secondary | ICD-10-CM

## 2018-10-21 DIAGNOSIS — Z113 Encounter for screening for infections with a predominantly sexual mode of transmission: Secondary | ICD-10-CM

## 2018-10-21 LAB — WET PREP FOR TRICH, YEAST, CLUE
Trichomonas Exam: NEGATIVE
Yeast Exam: NEGATIVE

## 2018-10-21 LAB — PREGNANCY, URINE: Preg Test, Ur: NEGATIVE

## 2018-10-21 MED ORDER — MEDROXYPROGESTERONE ACETATE 150 MG/ML IM SUSP
150.0000 mg | Freq: Once | INTRAMUSCULAR | Status: AC
  Administered 2018-10-21: 150 mg via INTRAMUSCULAR

## 2018-10-21 MED ORDER — METRONIDAZOLE 500 MG PO TABS
500.0000 mg | ORAL_TABLET | Freq: Two times a day (BID) | ORAL | 0 refills | Status: AC
Start: 2018-10-21 — End: 2018-10-28

## 2018-10-21 NOTE — Progress Notes (Signed)
Patient here for STD testing and Depo. Unsure when she had last Depo, last unprotected sex about 1 month ago, per patient.Jenetta Downer, RN

## 2018-10-21 NOTE — Progress Notes (Signed)
Patient treated for BV per SO and given Depo today. Next Depo reminder card given.Jenetta Downer, RN

## 2018-10-21 NOTE — Progress Notes (Signed)
   Wall problem visit  [place] Department  Subjective:  Ellen Morrison is a 38 y.o. being seen today for Depo and STD screen.   HPI  Client here today for STD testing.  States that she discovered that her partner "had cheated".  She has symptoms- sometimes has itching, intermittent lower abd. "cramping" especially if her bladder is full, small amt of white vaginal disch with odor x 2 wks, intermittent vaginal irritation and feels pressure with sex.    Does the patient have a current or past history of drug use? Yes .  Client smokes marijuana for her depression symptoms     Health Maintenance Due  Topic Date Due  . PAP SMEAR-Modifier  11/16/2002  . INFLUENZA VACCINE  08/30/2018    ROS  The following portions of the patient's history were reviewed and updated as appropriate: allergies, current medications, past family history, past medical history, past social history, past surgical history and problem list. Problem list updated.   See flowsheet for other program required questions.  Objective:   Vitals:   10/21/18 1400  BP: 112/75  Weight: 173 lb 12.8 oz (78.8 kg)  Height: 5\' 3"  (1.6 m)    Physical Exam Constitutional:      Appearance: Normal appearance.  HENT:     Mouth/Throat:     Pharynx: Oropharynx is clear. No oropharyngeal exudate or posterior oropharyngeal erythema.  Neck:     Musculoskeletal: Neck supple. No neck rigidity or muscular tenderness.  Genitourinary:    General: Normal vulva.     Vagina: Vaginal discharge present.     Comments: disch thin, white, mod amt, pH ,4.5 Bimanual- no CMT/ uterus firm/smooth/ adnexa no masses, tenderness Skin:    General: Skin is warm and dry.     Findings: No lesion or rash.    Assessment and Plan:  Ellen Morrison is a 38 y.o. female presenting to the [facility] Department for a Women's Health problem visit  1. Screening examination for venereal disease  - WET  PREP FOR [place], YEAST, CLUE - Chlamydia/Gonorrhea [facility] - HIV/HCV [place] [facility] - Syphilis Serology, [facility]   2. General counseling and advice on contraceptive management - Pregnancy, urine If PT negative client may have Depo today.  Co. That she needs a RP visit- when she calls for her next depo make an appt for the annual exam if clinic is offering exams.  Client's last well-woman visit in [place] - medroxyPROGESTERone (DEPO-PROVERA) injection 150 mg     No follow-ups on file.  No future appointments.  Hassell Done, FNP

## 2018-10-29 LAB — HM HEPATITIS C SCREENING LAB: HM Hepatitis Screen: NEGATIVE

## 2018-10-29 LAB — HM HIV SCREENING LAB: HM HIV Screening: NEGATIVE

## 2018-12-17 DIAGNOSIS — R000 Tachycardia, unspecified: Secondary | ICD-10-CM | POA: Diagnosis not present

## 2018-12-17 DIAGNOSIS — R0789 Other chest pain: Secondary | ICD-10-CM | POA: Diagnosis not present

## 2018-12-17 DIAGNOSIS — R457 State of emotional shock and stress, unspecified: Secondary | ICD-10-CM | POA: Diagnosis not present

## 2018-12-17 DIAGNOSIS — R52 Pain, unspecified: Secondary | ICD-10-CM | POA: Diagnosis not present

## 2018-12-17 DIAGNOSIS — R079 Chest pain, unspecified: Secondary | ICD-10-CM | POA: Diagnosis not present

## 2019-04-12 ENCOUNTER — Encounter (HOSPITAL_COMMUNITY): Payer: Self-pay | Admitting: Emergency Medicine

## 2019-04-12 ENCOUNTER — Emergency Department (HOSPITAL_COMMUNITY)
Admission: EM | Admit: 2019-04-12 | Discharge: 2019-04-12 | Disposition: A | Discharge status: Home / Self Care | Payer: Medicaid Other | Attending: Emergency Medicine | Admitting: Emergency Medicine

## 2019-04-12 ENCOUNTER — Emergency Department (HOSPITAL_COMMUNITY): Payer: Medicaid Other

## 2019-04-12 ENCOUNTER — Emergency Department (HOSPITAL_COMMUNITY)
Admission: EM | Admit: 2019-04-12 | Discharge: 2019-04-12 | Discharge status: Against Medical Advice | Payer: Medicaid Other

## 2019-04-12 DIAGNOSIS — N361 Urethral diverticulum: Secondary | ICD-10-CM | POA: Insufficient documentation

## 2019-04-12 DIAGNOSIS — N3001 Acute cystitis with hematuria: Secondary | ICD-10-CM

## 2019-04-12 DIAGNOSIS — F17210 Nicotine dependence, cigarettes, uncomplicated: Secondary | ICD-10-CM | POA: Diagnosis not present

## 2019-04-12 DIAGNOSIS — K6289 Other specified diseases of anus and rectum: Secondary | ICD-10-CM | POA: Diagnosis not present

## 2019-04-12 DIAGNOSIS — I10 Essential (primary) hypertension: Secondary | ICD-10-CM | POA: Insufficient documentation

## 2019-04-12 DIAGNOSIS — K649 Unspecified hemorrhoids: Secondary | ICD-10-CM

## 2019-04-12 LAB — URINALYSIS, ROUTINE W REFLEX MICROSCOPIC
Bilirubin Urine: NEGATIVE
Glucose, UA: NEGATIVE mg/dL
Ketones, ur: NEGATIVE mg/dL
Nitrite: NEGATIVE
Protein, ur: NEGATIVE mg/dL
Specific Gravity, Urine: 1.04 — ABNORMAL HIGH (ref 1.005–1.030)
WBC, UA: >50 WBC/hpf — ABNORMAL HIGH (ref 0–5)
pH: 7 (ref 5.0–8.0)

## 2019-04-12 LAB — I-STAT BETA HCG BLOOD, ED (MC, WL, AP ONLY): I-stat hCG, quantitative: <5 m[IU]/mL (ref <5)

## 2019-04-12 LAB — CBC WITH DIFFERENTIAL/PLATELET
Abs Immature Granulocytes: 0.02 10*3/uL (ref 0.00–0.07)
Basophils Absolute: 0 10*3/uL (ref 0.0–0.1)
Basophils Relative: 0 %
Eosinophils Absolute: 0.1 10*3/uL (ref 0.0–0.5)
Eosinophils Relative: 1 %
HCT: 41.2 % (ref 36.0–46.0)
Hemoglobin: 13.1 g/dL (ref 12.0–15.0)
Immature Granulocytes: 0 %
Lymphocytes Relative: 30 %
Lymphs Abs: 2.7 10*3/uL (ref 0.7–4.0)
MCH: 31.2 pg (ref 26.0–34.0)
MCHC: 31.8 g/dL (ref 30.0–36.0)
MCV: 98.1 fL (ref 80.0–100.0)
Monocytes Absolute: 0.7 10*3/uL (ref 0.1–1.0)
Monocytes Relative: 8 %
Neutro Abs: 5.5 10*3/uL (ref 1.7–7.7)
Neutrophils Relative %: 61 %
Platelets: 478 10*3/uL — ABNORMAL HIGH (ref 150–400)
RBC: 4.2 MIL/uL (ref 3.87–5.11)
RDW: 12.5 % (ref 11.5–15.5)
WBC: 9 10*3/uL (ref 4.0–10.5)
nRBC: 0 % (ref 0.0–0.2)

## 2019-04-12 LAB — BASIC METABOLIC PANEL
Anion gap: 7 (ref 5–15)
BUN: 14 mg/dL (ref 6–20)
CO2: 25 mmol/L (ref 22–32)
Calcium: 9.1 mg/dL (ref 8.9–10.3)
Chloride: 106 mmol/L (ref 98–111)
Creatinine, Ser: 0.99 mg/dL (ref 0.44–1.00)
GFR calc Af Amer: >60 mL/min (ref >60)
GFR calc non Af Amer: >60 mL/min (ref >60)
Glucose, Bld: 97 mg/dL (ref 70–99)
Potassium: 4.4 mmol/L (ref 3.5–5.1)
Sodium: 138 mmol/L (ref 135–145)

## 2019-04-12 MED ORDER — IOHEXOL 300 MG/ML  SOLN
100.0000 mL | Freq: Once | INTRAMUSCULAR | Status: AC | PRN
  Administered 2019-04-12: 100 mL via INTRAVENOUS

## 2019-04-12 MED ORDER — CEPHALEXIN 500 MG PO CAPS: 500.0000 mg | ORAL_CAPSULE | Freq: Four times a day (QID) | ORAL | 0 refills | Status: AC

## 2019-04-12 MED ORDER — HYDROCORTISONE (PERIANAL) 2.5 % EX CREA: 1.0000 "application " | TOPICAL_CREAM | Freq: Two times a day (BID) | CUTANEOUS | 0 refills | Status: DC

## 2019-04-12 MED ORDER — MORPHINE SULFATE (PF) 4 MG/ML IV SOLN
4.0000 mg | Freq: Once | INTRAVENOUS | Status: AC
  Administered 2019-04-12: 4 mg via INTRAVENOUS
  Filled 2019-04-12: qty 1

## 2019-04-12 MED ORDER — ONDANSETRON HCL 4 MG/2ML IJ SOLN
4.0000 mg | Freq: Once | INTRAMUSCULAR | Status: AC
  Administered 2019-04-12: 4 mg via INTRAVENOUS
  Filled 2019-04-12: qty 2

## 2019-04-12 MED ORDER — FENTANYL CITRATE (PF) 100 MCG/2ML IJ SOLN
100.0000 ug | Freq: Once | INTRAMUSCULAR | Status: AC
  Administered 2019-04-12: 100 ug via INTRAVENOUS
  Filled 2019-04-12: qty 2

## 2019-04-12 MED ORDER — SODIUM CHLORIDE (PF) 0.9 % IJ SOLN
INTRAMUSCULAR | Status: AC
  Filled 2019-04-12: qty 50

## 2019-04-12 NOTE — ED Triage Notes (Signed)
Per pt, states she was on her way home from work last night-felt some discomfort in her rectal area-states she took a shower then went to bed-states intense pain woke her out of her sleep-states "she doesn't do anal or anything like that, feels like something is shredding her ass hole"

## 2019-04-12 NOTE — ED Provider Notes (Signed)
[facility] DEPT Provider Note   CSN: [id number] Arrival date & time: 04/12/19  1210     History Chief Complaint  Patient presents with  . Rectal Pain    Ellen Morrison is a 39 y.o. female.  HPI Patient presents to the emergency department with rectal pain that started gradually last night as a discomfort and then became significant throughout the overnight hours and into today.  Patient states that it is very painful and hurts to sit.  The patient states the pain is right around her anal area and rectum.  Patient states it feels like it is on the inside.  Patient denies any anal sex or other manipulation of that region.  The patient denies chest pain, shortness of breath, headache,blurred vision, neck pain, fever, cough, weakness, numbness, dizziness, anorexia, edema, abdominal pain, nausea, vomiting, diarrhea, rash, back pain, dysuria, hematemesis, bloody stool, near syncope, or syncope.    Past Medical History:  Diagnosis Date  . Bipolar disease, manic ([place])   . Cannabis use disorder, severe, dependence ([place]) 06/09/2014  . Preeclampsia 7 years ago    with first pregnancy  . Stimulant use disorder (cocaine) moderate 06/09/2014    Patient Active Problem List   Diagnosis Date Noted  . HTN (hypertension) 06/11/2014  . Major depressive disorder, recurrent, severe without psychotic features ([place])   . Major depressive disorder, recurrent episode, severe ([place]) 06/09/2014  . PTSD (post-traumatic stress disorder) 06/09/2014  . Tobacco use disorder 06/09/2014  . Cluster B personality disorder ([place]) 06/09/2014    Past Surgical History:  Procedure Laterality Date  . CESAREAN SECTION  7 years ago     OB History    Gravida  3   Para  1   Term      Preterm  1   AB  1   Living  1     SAB      TAB  1   Ectopic      Multiple      Live Births  1           Family History  Problem Relation Age of Onset  . Schizophrenia Mother   .  Other Neg Hx     Social History   Tobacco Use  . Smoking status: Current Every Day Smoker    Packs/day: 0.50    Years: 19.00    Pack years: 9.50    Types: Cigarettes  . Smokeless tobacco: Never Used  Substance Use Topics  . Alcohol use: No  . Drug use: Yes    Types: Marijuana    Comment: weekends only    Home Medications Prior to Admission medications   Medication Sig Start Date End Date Taking? Authorizing Provider  amLODipine (NORVASC) 2.5 MG tablet Take 1 tablet (2.5 mg total) by mouth daily. Patient not taking: Reported on 10/21/2018 06/11/14   Hildred Priest, MD  cephALEXin (KEFLEX) 500 MG capsule Take 1 capsule (500 mg total) by mouth every 8 (eight) hours. Patient not taking: Reported on 10/06/2014 06/10/14   Hildred Priest, MD  doxycycline (VIBRAMYCIN) 100 MG capsule Take 1 capsule (100 mg total) by mouth 2 (two) times daily. Patient not taking: Reported on 10/21/2018 10/14/14   Domenic Moras, PA-C  FLUoxetine (PROZAC) 20 MG capsule Take 1 capsule (20 mg total) by mouth daily. Patient not taking: Reported on 10/21/2018 06/10/14   Hildred Priest, MD  haloperidol (HALDOL) 0.5 MG tablet Take 1 tablet (0.5 mg total) by mouth 3 (three)  times daily. Patient not taking: Reported on 10/06/2014 06/10/14   Jimmy Footman, MD  HYDROcodone-acetaminophen (NORCO/VICODIN) 5-325 MG per tablet Take 1 tablet by mouth every 6 (six) hours as needed for moderate pain or severe pain. Patient not taking: Reported on 10/21/2018 10/14/14   Fayrene Helper, PA-C  ibuprofen (ADVIL,MOTRIN) 800 MG tablet Take 1 tablet (800 mg total) by mouth 3 (three) times daily. Patient not taking: Reported on 10/21/2018 10/06/14   Elpidio Anis, PA-C  methocarbamol (ROBAXIN) 500 MG tablet Take 2 tablets (1,000 mg total) by mouth 2 (two) times daily. Patient not taking: Reported on 10/21/2018 10/06/14   Elpidio Anis, PA-C  nicotine (NICOTROL) 10 MG inhaler Inhale 1 cartridge (1 continuous  puffing total) into the lungs as needed for smoking cessation. Patient not taking: Reported on 10/06/2014 06/10/14   Jimmy Footman, MD  traZODone (DESYREL) 150 MG tablet Take 1 tablet (150 mg total) by mouth at bedtime as needed for sleep. Patient not taking: Reported on 10/06/2014 06/11/14   Jimmy Footman, MD    Allergies    Geodon [ziprasidone hcl] and Penicillins  Review of Systems   Review of Systems All other systems negative except as documented in the HPI. All pertinent positives and negatives as reviewed in the HPI. Physical Exam Updated Vital Signs BP (!) 136/101   Pulse 78   Temp 98.2 F (36.8 C) (Oral) Comment: Simultaneous filing. User may not have seen previous data. Comment (Src): Simultaneous filing. User may not have seen previous data.  Resp 18   SpO2 97%   Physical Exam Vitals and nursing note reviewed.  Constitutional:      General: She is not in acute distress.    Appearance: She is well-developed.  HENT:     Head: Normocephalic and atraumatic.  Eyes:     Pupils: Pupils are equal, round, and reactive to light.  Cardiovascular:     Rate and Rhythm: Normal rate and regular rhythm.     Heart sounds: Normal heart sounds. No murmur. No friction rub. No gallop.   Pulmonary:     Effort: Pulmonary effort is normal. No respiratory distress.     Breath sounds: Normal breath sounds. No wheezing.  Abdominal:     General: Bowel sounds are normal. There is no distension.     Palpations: Abdomen is soft.     Tenderness: There is no abdominal tenderness.  Genitourinary:   Musculoskeletal:     Cervical back: Normal range of motion and neck supple.  Skin:    General: Skin is warm and dry.     Capillary Refill: Capillary refill takes less than 2 seconds.     Findings: No erythema or rash.  Neurological:     Mental Status: She is alert and oriented to person, place, and time.     Motor: No abnormal muscle tone.     Coordination: Coordination  normal.  Psychiatric:        Behavior: Behavior normal.     ED Results / Procedures / Treatments   Labs (all labs ordered are listed, but only abnormal results are displayed) Labs Reviewed  CBC WITH DIFFERENTIAL/PLATELET - Abnormal; Notable for the following components:      Result Value   Platelets 478 (*)    All other components within normal limits  BASIC METABOLIC PANEL  I-STAT BETA HCG BLOOD, ED ([facility], [facility], [facility] ONLY)    EKG None  Radiology No results found.  Procedures Procedures (including critical care time)  Medications Ordered in  ED Medications  morphine 4 MG/ML injection 4 mg (4 mg Intravenous Given 04/12/19 1358)    ED Course  I have reviewed the triage vital signs and the nursing notes.  Pertinent labs & imaging results that were available during my care of the patient were reviewed by me and considered in my medical decision making (see chart for details).    MDM Rules/Calculators/A&P                      Patient will need CT scan imaging to further assess for any abscesses or infectious process that is causing an issue around her anal area. Final Clinical Impression(s) / ED Diagnoses Final diagnoses:  None    Rx / DC Orders ED Discharge Orders    None       Charlestine Night, PA-C 04/12/19 1503    Rolan Bucco, MD 04/12/19 1515

## 2019-04-12 NOTE — Discharge Instructions (Addendum)
You can take Tylenol or Ibuprofen as directed for pain. You can alternate Tylenol and Ibuprofen every 4 hours. If you take Tylenol at 1pm, then you can take Ibuprofen at 5pm. Then you can take Tylenol again at 9pm.   Apply anusol directly to the anus.   Take antibiotics as directed. Please take all of your antibiotics until finished.  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 This should be followed up with Urology.   Follow up with Gen Surg if your hemorrhoids do not improve.  Return to the Emergency Department for any worsening pain, fever, vomiting or any other worsening or concerning symptoms.

## 2019-04-12 NOTE — ED Triage Notes (Signed)
Per patient and last encounter, sudden onset of rectal pain last night-patient left triage 15 minutes ago due to wait, tearful and yelling at staff-

## 2019-04-12 NOTE — ED Notes (Signed)
Patient transported to CT 

## 2019-04-12 NOTE — ED Provider Notes (Signed)
  Care assumed from [facility], PA-C at shift change with CT pending.   In brief, this patient is a 39 y.o. F who presents for evaluation of anal discomfort that began last night. Patient reports that it started out mild and progressively worsened throughout the night. Pain worse with attempts to sit. Denies anal sex or insertion of any object into the rectum. Please see note from previous provider for full history/physical exam.   Physical Exam  BP 117/88   Pulse 76   Temp 98.2 F (36.8 C) (Oral) Comment: Simultaneous filing. User may not have seen previous data. Comment (Src): Simultaneous filing. User may not have seen previous data.  Resp 18   SpO2 100%   Physical Exam   The exam was performed with a chaperone present. She has a small nonthrombosed external hemorrhoid noted to about the 2-3:00 region of the anus.  Tenderness noted to the hemorrhoid.  No obvious warmth, erythema.  No other evidence of abscess.  ED Course/Procedures     Procedures  MDM    PLAN: Patient pending CT pelvis for evaluation of abscess.   MDM:  I-stat beta negative. CBC shows no leukocytosis or anemia. BMP unremarkable.   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  No other acute abnormalities.  No evidence of perianal or perirectal abscess.  No soft tissue or bowel inflammation.  Given the urethral diverticulum, will plan to check UA to ensure she does not have any UTI.  I discussed results with patient.  Patient was very upset and was tearful.  She was requesting pain medications.  She states that she is not having any pain in the vagina that it is all around the anus.  On my evaluation, she did have a small nonthrombosed hemorrhoid about the 2-3:00 region.  Question if this is contributing to her pain.  She is hemodynamically stable.  Will give dose of pain medication and check urine.  UA shows leukocytes, pyuria and bacteria.   Updated patient on results.  Feeling much more comfortable.   Allergy to penicillin but has tolerated keflex in the past. Will plan to treat. At this time, I feel the likely etiology of her symptoms is related to hemorrhoid. Will give her Anusol and Gen Surg follow up. At this time, patient exhibits no emergent life-threatening condition that require further evaluation in ED or admission. Patient had ample opportunity for questions and discussion. All patient's questions were answered with full understanding. Strict return precautions discussed. Patient expresses understanding and agreement to plan.    1. Rectal pain   2. Hemorrhoids, unspecified hemorrhoid type   3. Urethra, diverticulum   4. Acute cystitis with hematuria     Portions of this note were generated with Dragon dictation software. Dictation errors may occur despite best attempts at proofreading.     Maxwell Caul, PA-C 04/12/19 2041    Tilden Fossa, MD 04/12/19 2348

## 2020-07-19 ENCOUNTER — Emergency Department (HOSPITAL_COMMUNITY)
Admission: EM | Admit: 2020-07-19 | Discharge: 2020-07-19 | Discharge status: Against Medical Advice | Payer: Medicaid Other | Attending: Emergency Medicine | Admitting: Emergency Medicine

## 2020-07-19 DIAGNOSIS — M79601 Pain in right arm: Secondary | ICD-10-CM | POA: Insufficient documentation

## 2020-07-19 DIAGNOSIS — Z5321 Procedure and treatment not carried out due to patient leaving prior to being seen by health care provider: Secondary | ICD-10-CM | POA: Diagnosis not present

## 2020-07-19 NOTE — ED Notes (Signed)
Pt has requested that we let her step outside before calling her back to triage

## 2020-07-19 NOTE — ED Notes (Signed)
Patient came out of her room complaining about wait times and was verbally aggressive to [place], [place]. Patient attempted to take a picture of Daphine Deutscher and states she was placing this on Facebook. Patient left the triage area yelling.

## 2020-07-19 NOTE — ED Notes (Signed)
Pt came out of room aggressive because of wait time.  Pt wanted to go outside to smoke a cigarette.   I informed  the pt in front of staff that this is a non smoking campus.   She started to say that we were on face book and she wanted a tylenol.  I informed her that everyone is aware of the state of the healthcare system.   She got upset and started to become very loud and overly reactive.
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
## 2020-07-20 ENCOUNTER — Telehealth: Payer: Self-pay

## 2020-07-20 NOTE — Telephone Encounter (Signed)
Transition Care Management Unsuccessful Follow-up Telephone Call  Date of discharge and from where:  07/19/2020 from [facility]  Attempts:  1st Attempt  Reason for unsuccessful TCM follow-up call:  Unable to leave message
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
## 2021-10-19 ENCOUNTER — Telehealth: Payer: Self-pay

## 2021-10-19 NOTE — Telephone Encounter (Signed)
Patient called to connect with a Primary Care Provider. Unable to leave VM, VM not set up/VM full. Patient has Managed Medicaid. If patient returns call, please reach out to Ezinne Yogi or Leslie, RN.   

## 2021-10-26 ENCOUNTER — Telehealth: Payer: Self-pay

## 2021-10-26 NOTE — Telephone Encounter (Signed)
Patient called to connect with a Primary Care Provider. Unable to leave VM, VM not set up/VM full. Patient has Managed Medicaid. If patient returns call, please reach out to Alissa Pharr or Leslie, RN.   

## 2021-11-03 ENCOUNTER — Telehealth: Payer: Self-pay

## 2021-11-03 NOTE — Telephone Encounter (Signed)
Patient called to connect with a Primary Care Provider. Unable to leave VM, VM not set up/VM full. Patient has Managed Medicaid. If patient returns call, please reach out to Yuvia Plant or Leslie, RN.   

## 2021-11-18 IMAGING — CT CT PELVIS W/ CM
2 of 3 series · 16 of 46 positions shown, 18 images · IV contrast (APPLIED)
Comparison: None.

CLINICAL DATA: Rectal irritation last night. Severe rectal pain
this morning.

EXAM:
CT PELVIS WITH CONTRAST
TECHNIQUE: Multidetector CT imaging of the pelvis was performed using the
standard protocol following the bolus administration of intravenous
contrast.
CONTRAST:  100mL OMNIPAQUE IOHEXOL 300 MG/ML  SOLN

[Series 2: axial st · axial · 0.88mm/px · z∈[-444,-184]mm · 13 of 60 slices shown, 15 images]
[im 4/60  soft-tissue]
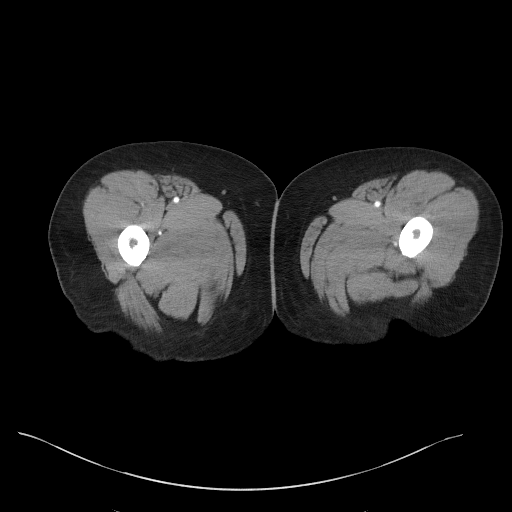
[im 4/60  bone]
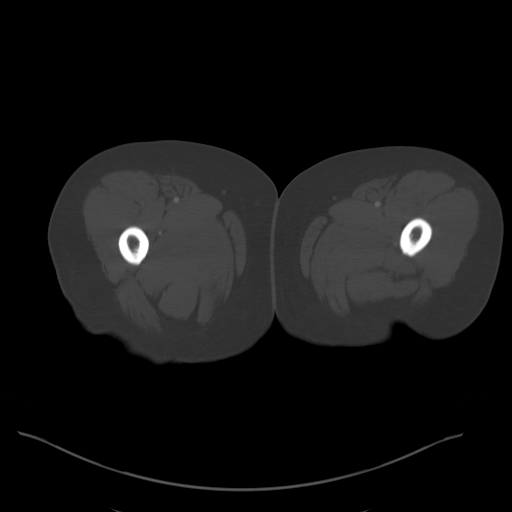
[im 8/60  soft-tissue]
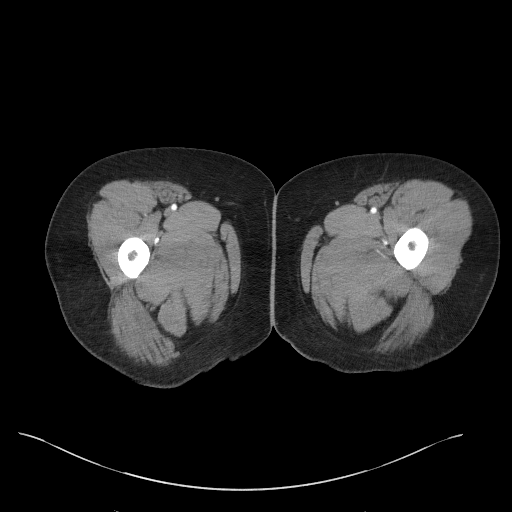
[im 12/60  soft-tissue]
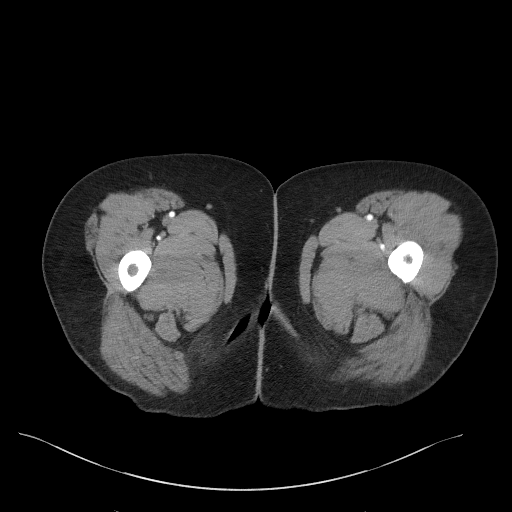
[im 18/60  soft-tissue]
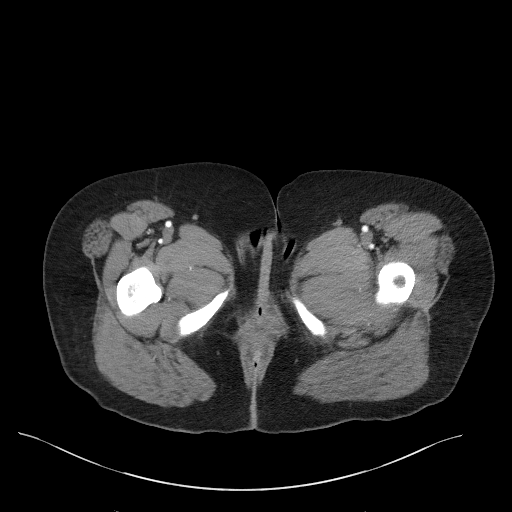
[im 21/60  soft-tissue]
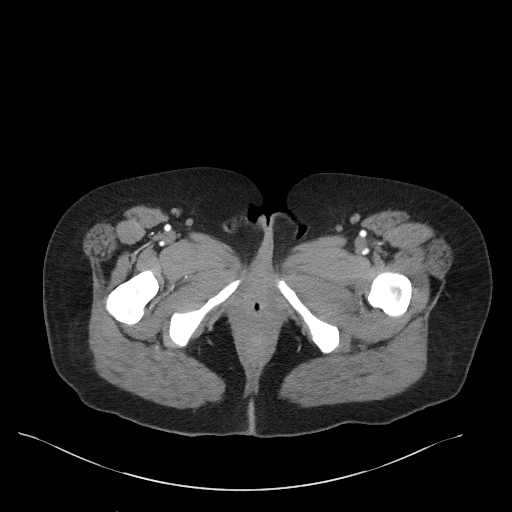
[im 25/60  soft-tissue]
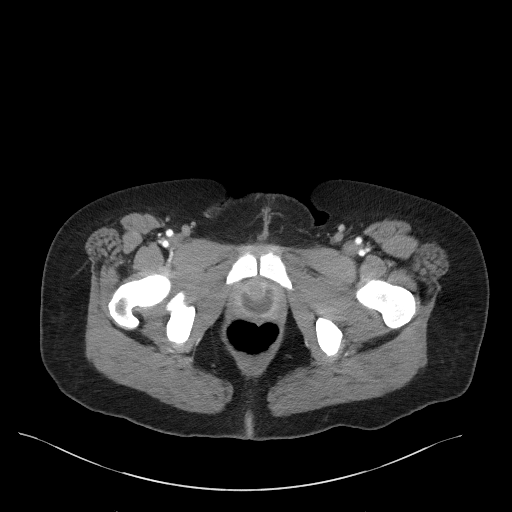
[im 31/60  soft-tissue]
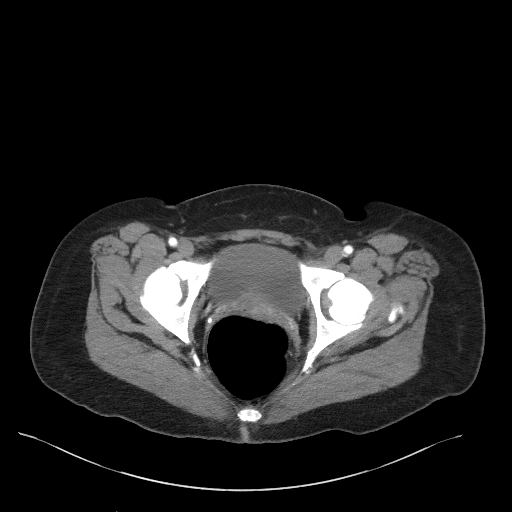
[im 35/60  soft-tissue]
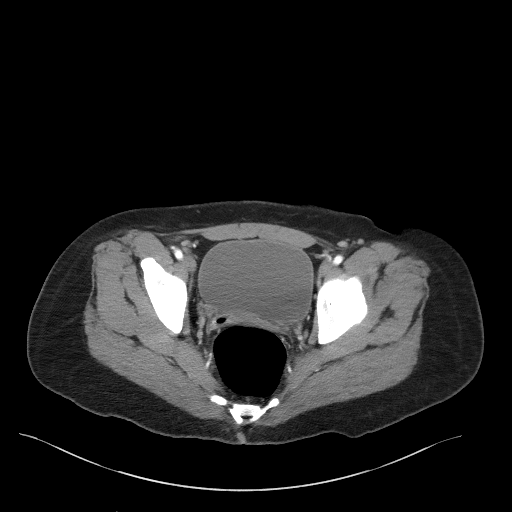
[im 39/60  soft-tissue]
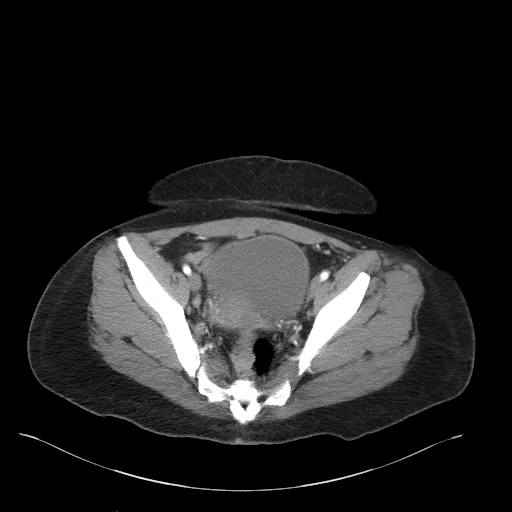
[im 39/60  bone]
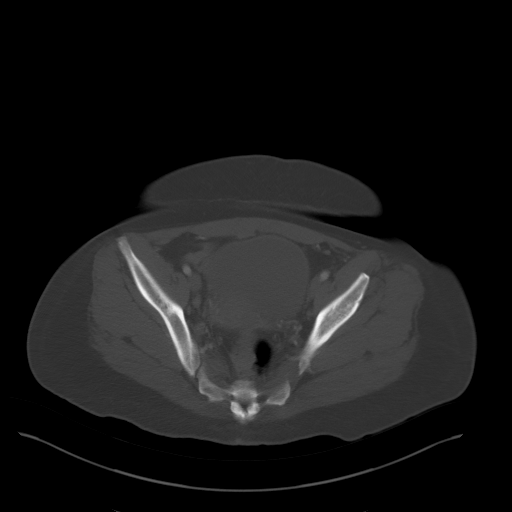
[im 42/60  soft-tissue]
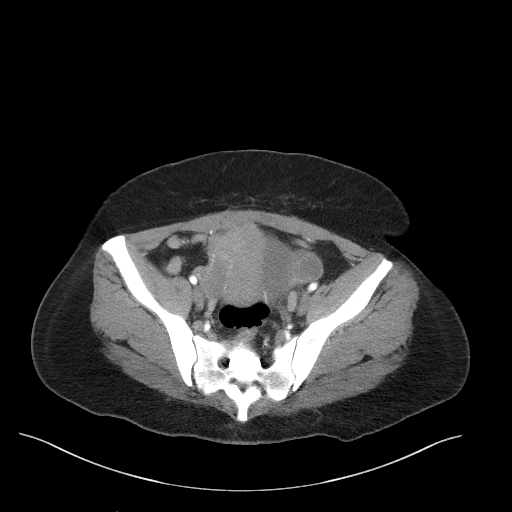
[im 48/60  soft-tissue]
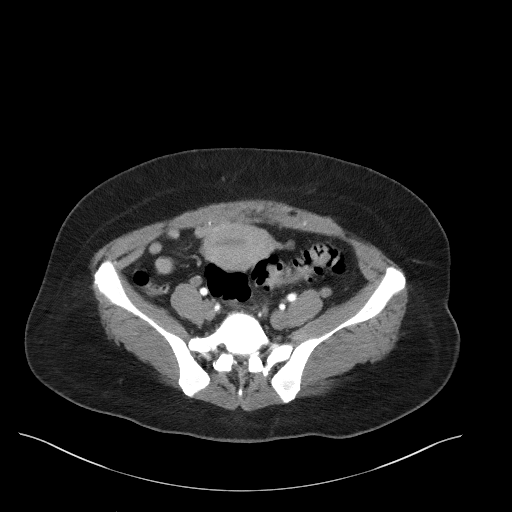
[im 52/60  soft-tissue]
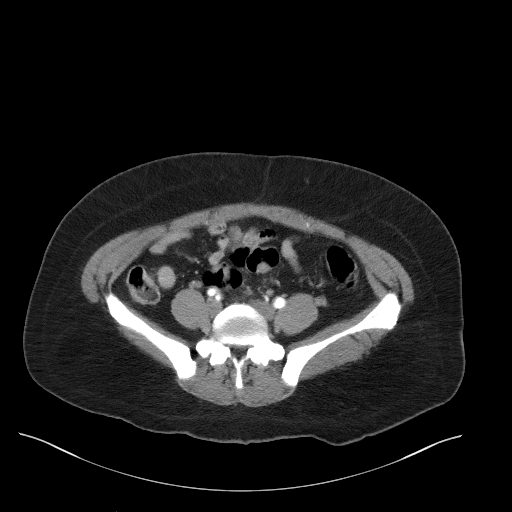
[im 56/60  soft-tissue]
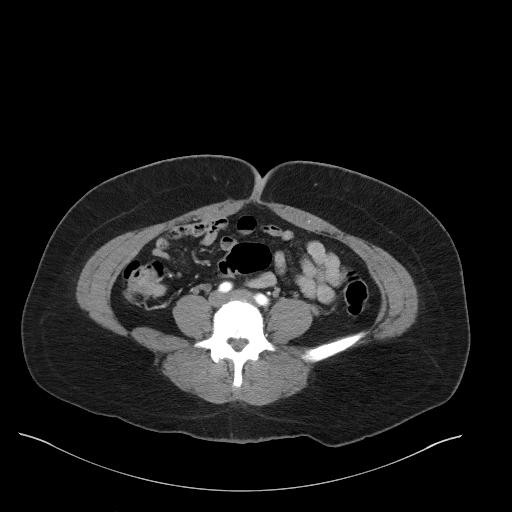

[Series 4: coronal st · coronal · 0.60mm/px · 3 of 78 slices shown]
[im 26/78  soft-tissue]
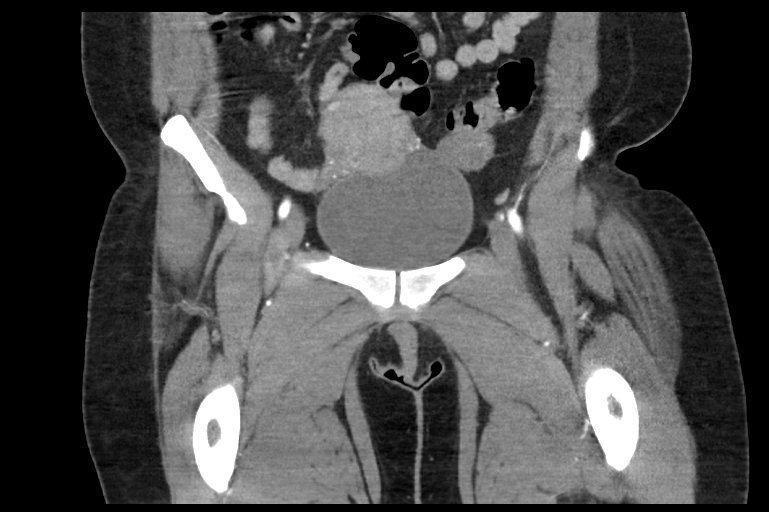
[im 35/78  soft-tissue]
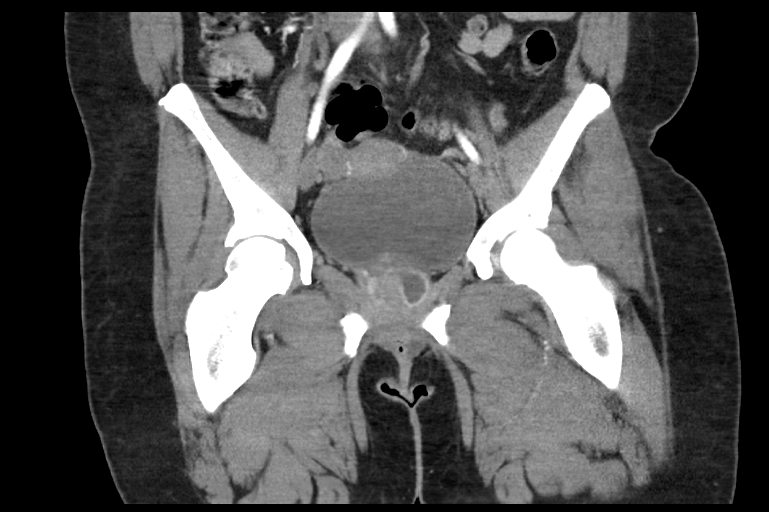
[im 43/78  soft-tissue]
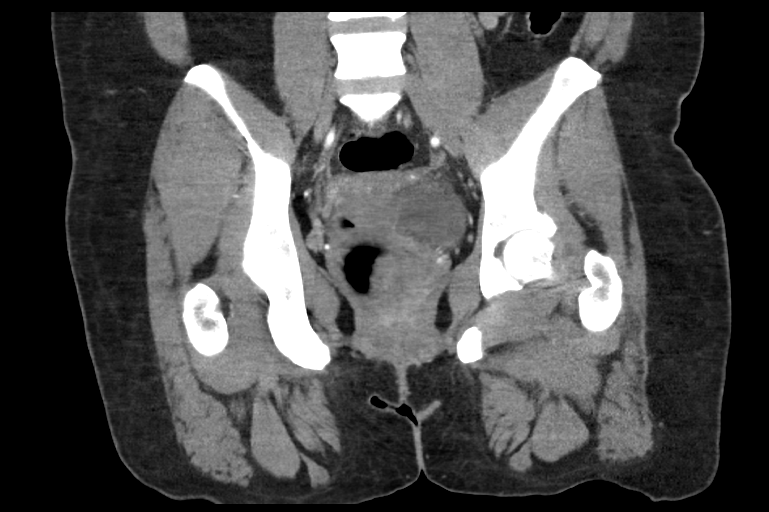

[16 of 46 positions shown; findings below may reference images not displayed]

FINDINGS: Urinary Tract: Fluid collection borders the posterior aspect of the
urethra, measuring 3.5 x 3.0 x 2.9 cm, consistent with a urethral
diverticulum. Normal appearance of the bladder. Distal ureters
normal in course and caliber.

Bowel: Sigmoid colon diverticula. No bowel dilation, wall thickening
or inflammation. Normal appendix visualized.

Vascular/Lymphatic: No significant vascular abnormality. No enlarged
lymph nodes.

Reproductive:  Normal uterus and adnexa.

Other: No abdominal wall hernia. No ascites. No soft tissue
inflammation or abscess.

Musculoskeletal: No fracture or acute finding.  No bone lesion.
IMPRESSION: 1. 3.5 cm urethral diverticulum, which may be the source of this
patient's pain.
2. No other abnormalities. No evidence of a perianal or perirectal
abscess. No soft tissue or bowel inflammation.

## 2022-07-11 DIAGNOSIS — F603 Borderline personality disorder: Secondary | ICD-10-CM | POA: Diagnosis not present

## 2023-11-26 ENCOUNTER — Other Ambulatory Visit: Payer: Self-pay

## 2023-11-26 ENCOUNTER — Emergency Department
Admission: EM | Admit: 2023-11-26 | Discharge: 2023-11-27 | Disposition: A | Discharge status: Other Acute Inpatient Hospital | Attending: Emergency Medicine | Admitting: Emergency Medicine

## 2023-11-26 DIAGNOSIS — F1990 Other psychoactive substance use, unspecified, uncomplicated: Secondary | ICD-10-CM | POA: Diagnosis not present

## 2023-11-26 DIAGNOSIS — F419 Anxiety disorder, unspecified: Secondary | ICD-10-CM

## 2023-11-26 DIAGNOSIS — I10 Essential (primary) hypertension: Secondary | ICD-10-CM | POA: Insufficient documentation

## 2023-11-26 DIAGNOSIS — R440 Auditory hallucinations: Secondary | ICD-10-CM | POA: Diagnosis not present

## 2023-11-26 DIAGNOSIS — F1910 Other psychoactive substance abuse, uncomplicated: Secondary | ICD-10-CM | POA: Insufficient documentation

## 2023-11-26 DIAGNOSIS — F17210 Nicotine dependence, cigarettes, uncomplicated: Secondary | ICD-10-CM | POA: Insufficient documentation

## 2023-11-26 DIAGNOSIS — F32A Depression, unspecified: Secondary | ICD-10-CM

## 2023-11-26 DIAGNOSIS — R45851 Suicidal ideations: Secondary | ICD-10-CM | POA: Diagnosis not present

## 2023-11-26 DIAGNOSIS — E876 Hypokalemia: Secondary | ICD-10-CM | POA: Diagnosis not present

## 2023-11-26 DIAGNOSIS — R443 Hallucinations, unspecified: Secondary | ICD-10-CM

## 2023-11-26 LAB — POC URINE PREG, ED: Preg Test, Ur: NEGATIVE

## 2023-11-26 LAB — COMPREHENSIVE METABOLIC PANEL WITH GFR
ALT: 14 U/L (ref 0–44)
AST: 19 U/L (ref 15–41)
Albumin: 4.2 g/dL (ref 3.5–5.0)
Alkaline Phosphatase: 74 U/L (ref 38–126)
Anion gap: 15 (ref 5–15)
BUN: 12 mg/dL (ref 6–20)
CO2: 22 mmol/L (ref 22–32)
Calcium: 9.1 mg/dL (ref 8.9–10.3)
Chloride: 102 mmol/L (ref 98–111)
Creatinine, Ser: 0.83 mg/dL (ref 0.44–1.00)
GFR, Estimated: >60 mL/min (ref >60)
Glucose, Bld: 97 mg/dL (ref 70–99)
Potassium: 3.3 mmol/L — ABNORMAL LOW (ref 3.5–5.1)
Sodium: 139 mmol/L (ref 135–145)
Total Bilirubin: 0.4 mg/dL (ref 0.0–1.2)
Total Protein: 8.7 g/dL — ABNORMAL HIGH (ref 6.5–8.1)

## 2023-11-26 LAB — CBC
HCT: 44.4 % (ref 36.0–46.0)
Hemoglobin: 14.6 g/dL (ref 12.0–15.0)
MCH: 30.4 pg (ref 26.0–34.0)
MCHC: 32.9 g/dL (ref 30.0–36.0)
MCV: 92.3 fL (ref 80.0–100.0)
Platelets: 456 K/uL — ABNORMAL HIGH (ref 150–400)
RBC: 4.81 MIL/uL (ref 3.87–5.11)
RDW: 13.2 % (ref 11.5–15.5)
WBC: 7.5 K/uL (ref 4.0–10.5)
nRBC: 0 % (ref 0.0–0.2)

## 2023-11-26 LAB — URINE DRUG SCREEN, QUALITATIVE (ARMC ONLY)
Amphetamines, Ur Screen: NOT DETECTED
Barbiturates, Ur Screen: NOT DETECTED
Benzodiazepine, Ur Scrn: NOT DETECTED
Cannabinoid 50 Ng, Ur ~~LOC~~: POSITIVE — AB
Cocaine Metabolite,Ur ~~LOC~~: POSITIVE — AB
MDMA (Ecstasy)Ur Screen: NOT DETECTED
Methadone Scn, Ur: NOT DETECTED
Opiate, Ur Screen: NOT DETECTED
Phencyclidine (PCP) Ur S: NOT DETECTED
Tricyclic, Ur Screen: NOT DETECTED

## 2023-11-26 LAB — PREGNANCY, URINE: Preg Test, Ur: NEGATIVE

## 2023-11-26 LAB — SALICYLATE LEVEL: Salicylate Lvl: <7 mg/dL — ABNORMAL LOW (ref 7.0–30.0)

## 2023-11-26 LAB — ETHANOL: Alcohol, Ethyl (B): <15 mg/dL (ref <15)

## 2023-11-26 LAB — ACETAMINOPHEN LEVEL: Acetaminophen (Tylenol), Serum: <10 ug/mL — ABNORMAL LOW (ref 10–30)

## 2023-11-26 MED ORDER — LORAZEPAM 1 MG PO TABS
1.0000 mg | ORAL_TABLET | Freq: Once | ORAL | Status: AC
  Administered 2023-11-26: 1 mg via ORAL
  Filled 2023-11-26: qty 1

## 2023-11-26 MED ORDER — POTASSIUM CHLORIDE CRYS ER 20 MEQ PO TBCR
40.0000 meq | EXTENDED_RELEASE_TABLET | Freq: Once | ORAL | Status: AC
Start: 2023-11-26 — End: 2023-11-26
  Administered 2023-11-26: 40 meq via ORAL
  Filled 2023-11-26: qty 2

## 2023-11-26 MED ORDER — DIPHENHYDRAMINE HCL 25 MG PO CAPS
50.0000 mg | ORAL_CAPSULE | ORAL | Status: AC
  Administered 2023-11-26: 50 mg via ORAL
  Filled 2023-11-26: qty 2

## 2023-11-26 MED ORDER — OLANZAPINE 10 MG PO TABS
10.0000 mg | ORAL_TABLET | ORAL | Status: AC
  Administered 2023-11-26: 10 mg via ORAL
  Filled 2023-11-26: qty 1

## 2023-11-26 NOTE — ED Triage Notes (Signed)
 Patient ambulatory to triage with complaints of wanting to harm hersself. She keeps repeating I am just so tired. Endorses loose plan of wanting to take pills or drive her car off a bridge or into traffic.

## 2023-11-26 NOTE — ED Notes (Signed)
 Received this pt from the quad.  Pt informed of cameras on the unit, bathroom and rules listed on the door at nursing station.  Pt calm and cooperative.  Meds given.  Pt watching tv in her room
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
## 2023-11-26 NOTE — BH Assessment (Incomplete)
 Comprehensive Clinical Assessment (CCA) Note  11/26/2023 SKY PRIMO [id number]  Chief Complaint:  Chief Complaint  Patient presents with  . Mental Health Problem   Visit Diagnosis: ***    CCA Screening, Triage and Referral (STR)  Patient Reported Information How did you hear about us ? No data recorded Referral name: No data recorded Referral phone number: No data recorded  Whom do you see for routine medical problems? No data recorded Practice/Facility Name: No data recorded Practice/Facility Phone Number: No data recorded Name of Contact: No data recorded Contact Number: No data recorded Contact Fax Number: No data recorded Prescriber Name: No data recorded Prescriber Address (if known): No data recorded  What Is the Reason for Your Visit/Call Today? No data recorded How Long Has This Been Causing You Problems? No data recorded What Do You Feel Would Help You the Most Today? No data recorded  Have You Recently Been in Any Inpatient Treatment (Hospital/Detox/Crisis Center/28-Day Program)? No data recorded Name/Location of Program/Hospital:No data recorded How Long Were You There? No data recorded When Were You Discharged? No data recorded  Have You Ever Received Services From [facility] Before? No data recorded Who Do You See at [facility]? No data recorded  Have You Recently Had Any Thoughts About Hurting Yourself? No data recorded Are You Planning to Commit Suicide/Harm Yourself At This time? No data recorded  Have you Recently Had Thoughts About Hurting Someone Sherral? No data recorded Explanation: No data recorded  Have You Used Any Alcohol or Drugs in the Past 24 Hours? No data recorded How Long Ago Did You Use Drugs or Alcohol? No data recorded What Did You Use and How Much? No data recorded  Do You Currently Have a Therapist/Psychiatrist? No data recorded Name of Therapist/Psychiatrist: No data recorded  Have You Been Recently Discharged From Any  Office Practice or Programs? No data recorded Explanation of Discharge From Practice/Program: No data recorded    CCA Screening Triage Referral Assessment Type of Contact: No data recorded Is this Initial or Reassessment? No data recorded Date Telepsych consult ordered in [facility]L:  No data recorded Time Telepsych consult ordered in CHL:  No data recorded  Patient Reported Information Reviewed? No data recorded Patient Left Without Being Seen? No data recorded Reason for Not Completing Assessment: No data recorded  Collateral Involvement: No data recorded  Does Patient Have a Court Appointed Legal Guardian? No data recorded Name and Contact of Legal Guardian: No data recorded If Minor and Not Living with Parent(s), Who has Custody? No data recorded Is CPS involved or ever been involved? No data recorded Is APS involved or ever been involved? No data recorded  Patient Determined To Be At Risk for Harm To Self or Others Based on Review of Patient Reported Information or Presenting Complaint? No data recorded Method: No data recorded Availability of Means: No data recorded Intent: No data recorded Notification Required: No data recorded Additional Information for Danger to Others Potential: No data recorded Additional Comments for Danger to Others Potential: No data recorded Are There Guns or Other Weapons in Your Home? No data recorded Types of Guns/Weapons: No data recorded Are These Weapons Safely Secured?                            No data recorded Who Could Verify You Are Able To Have These Secured: No data recorded Do You Have any Outstanding Charges, Pending Court Dates, Parole/Probation? No data  recorded Contacted To Inform of Risk of Harm To Self or Others: No data recorded  Location of Assessment: No data recorded  Does Patient Present under Involuntary Commitment? No data recorded IVC Papers Initial File Date: No data recorded  [place] of Residence: No data  recorded  Patient Currently Receiving the Following Services: No data recorded  Determination of Need: No data recorded  Options For Referral: No data recorded    CCA Biopsychosocial Intake/Chief Complaint:  No data recorded Current Symptoms/Problems: No data recorded  Patient Reported Schizophrenia/Schizoaffective Diagnosis in Past: No data recorded  Strengths: No data recorded Preferences: No data recorded Abilities: No data recorded  Type of Services Patient Feels are Needed: No data recorded  Initial Clinical Notes/Concerns: No data recorded  Mental Health Symptoms Depression:  No data recorded  Duration of Depressive symptoms: No data recorded  Mania:  No data recorded  Anxiety:   No data recorded  Psychosis:  No data recorded  Duration of Psychotic symptoms: No data recorded  Trauma:  No data recorded  Obsessions:  No data recorded  Compulsions:  No data recorded  Inattention:  No data recorded  Hyperactivity/Impulsivity:  No data recorded  Oppositional/Defiant Behaviors:  No data recorded  Emotional Irregularity:  No data recorded  Other Mood/Personality Symptoms:  No data recorded   Mental Status Exam Appearance and self-care  Stature:  No data recorded  Weight:  No data recorded  Clothing:  No data recorded  Grooming:  No data recorded  Cosmetic use:  No data recorded  Posture/gait:  No data recorded  Motor activity:  No data recorded  Sensorium  Attention:  No data recorded  Concentration:  No data recorded  Orientation:  No data recorded  Recall/memory:  No data recorded  Affect and Mood  Affect:  No data recorded  Mood:  No data recorded  Relating  Eye contact:  No data recorded  Facial expression:  No data recorded  Attitude toward examiner:  No data recorded  Thought and Language  Speech flow: No data recorded  Thought content:  No data recorded  Preoccupation:  No data recorded  Hallucinations:  No data recorded  Organization:  No data  recorded  [employer] of Knowledge:  No data recorded  Intelligence:  No data recorded  Abstraction:  No data recorded  Judgement:  No data recorded  Reality Testing:  No data recorded  Insight:  No data recorded  Decision Making:  No data recorded  Social Functioning  Social Maturity:  No data recorded  Social Judgement:  No data recorded  Stress  Stressors:  No data recorded  Coping Ability:  No data recorded  Skill Deficits:  No data recorded  Supports:  No data recorded    Religion:    Leisure/Recreation:    Exercise/Diet:     CCA Employment/Education Employment/Work Situation:    Education:     CCA Family/Childhood History Family and Relationship History:    Childhood History:     Child/Adolescent Assessment:     CCA Substance Use Alcohol/Drug Use:                           ASAM's:  Six Dimensions of Multidimensional Assessment  Dimension 1:  Acute Intoxication and/or Withdrawal Potential:      Dimension 2:  Biomedical Conditions and Complications:      Dimension 3:  Emotional, Behavioral, or Cognitive Conditions and Complications:  Dimension 4:  Readiness to Change:     Dimension 5:  Relapse, Continued use, or Continued Problem Potential:     Dimension 6:  Recovery/Living Environment:     ASAM Severity Score:    ASAM Recommended Level of Treatment:     Substance use Disorder (SUD)    Recommendations for Services/Supports/Treatments:    DSM5 Diagnoses: Patient Active Problem List   Diagnosis Date Noted  . HTN (hypertension) 06/11/2014  . Major depressive disorder, recurrent, severe without psychotic features (HCC)   . Major depressive disorder, recurrent episode, severe (HCC) 06/09/2014  . PTSD (post-traumatic stress disorder) 06/09/2014  . Tobacco use disorder 06/09/2014  . Cluster B personality disorder (HCC) 06/09/2014    Patient Centered Plan: Patient is on the following Treatment Plan(s):  {CHL  AMB BH OP Treatment Plans:[id number]}   Referrals to Alternative Service(s): Referred to Alternative Service(s):   Place:   Date:   Time:    Referred to Alternative Service(s):   Place:   Date:   Time:    Referred to Alternative Service(s):   Place:   Date:   Time:    Referred to Alternative Service(s):   Place:   Date:   Time:      @BHCOLLABOFCARE @  Cleaven Demario R Dayana Dalporto, LCAS
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
## 2023-11-26 NOTE — ED Provider Notes (Signed)
 [facility] Provider Note    Event Date/Time   First MD Initiated Contact with Patient 11/26/23 2116     (approximate)   History   Chief Complaint Mental Health Problem   HPI  Ellen Morrison is a 43 y.o. female with past medical history of hypertension, bipolar disorder, PTSD who presents to the ED complaining of suicidal ideation.  Patient reports that she has been hearing voices in her head for the past few days and has had difficulty sleeping.  She reports thoughts of suicide due to this with plan to overdose on a benzodiazepine.  She denies any medical complaints, denies any alcohol or drug use.     Physical Exam   Triage Vital Signs: ED Triage Vitals [11/26/23 2059]  Encounter Vitals Group     BP (!) 157/106     Girls Systolic BP Percentile      Girls Diastolic BP Percentile      Boys Systolic BP Percentile      Boys Diastolic BP Percentile      Pulse Rate (!) 110     Resp 18     Temp 98.6 F (37 C)     Temp Source Oral     SpO2 97 %     Weight 204 lb (92.5 kg)     Height 5' 3 (1.6 m)     Head Circumference      Peak Flow      Pain Score 0     Pain Loc      Pain Education      Exclude from Growth Chart     Most recent vital signs: Vitals:   11/26/23 2059  BP: (!) 157/106  Pulse: (!) 110  Resp: 18  Temp: 98.6 F (37 C)  SpO2: 97%    Constitutional: Alert and oriented. Eyes: Conjunctivae are normal. Head: Atraumatic. Nose: No congestion/rhinnorhea. Mouth/Throat: Mucous membranes are moist.  Cardiovascular: Normal rate, regular rhythm. Grossly normal heart sounds.  2+ radial pulses bilaterally. Respiratory: Normal respiratory effort.  No retractions. Lungs CTAB. Gastrointestinal: Soft and nontender. No distention. Musculoskeletal: No lower extremity tenderness nor edema.  Neurologic:  Normal speech and language. No gross focal neurologic deficits are appreciated.    ED Results / Procedures / Treatments   Labs (all  labs ordered are listed, but only abnormal results are displayed) Labs Reviewed  COMPREHENSIVE METABOLIC PANEL WITH GFR - Abnormal; Notable for the following components:      Result Value   Potassium 3.3 (*)    Total Protein 8.7 (*)    All other components within normal limits  CBC - Abnormal; Notable for the following components:   Platelets 456 (*)    All other components within normal limits  URINE DRUG SCREEN, QUALITATIVE ([facility] ONLY) - Abnormal; Notable for the following components:   Cocaine Metabolite,Ur [place] POSITIVE (*)    Cannabinoid 50 Ng, Ur [place] POSITIVE (*)    All other components within normal limits  ACETAMINOPHEN  LEVEL - Abnormal; Notable for the following components:   Acetaminophen  (Tylenol ), Serum <10 (*)    All other components within normal limits  SALICYLATE LEVEL - Abnormal; Notable for the following components:   Salicylate Lvl <7.0 (*)    All other components within normal limits  PREGNANCY, URINE  ETHANOL  POC URINE PREG, ED    PROCEDURES:  Critical Care performed: No  Procedures   MEDICATIONS ORDERED IN ED: Medications  potassium chloride SA (KLOR-CON M) CR tablet  40 mEq (has no administration in time range)  LORazepam  (ATIVAN ) tablet 1 mg (1 mg Oral Given 11/26/23 2130)     IMPRESSION / MDM / ASSESSMENT AND PLAN / ED COURSE  I reviewed the triage vital signs and the nursing notes.                              43 y.o. female with past medical history of hypertension, bipolar disorder, and PTSD who presents to the ED for psychiatric evaluation due to hallucinations, difficulty sleeping, and increasing thoughts of suicide.  Patient's presentation is most consistent with acute presentation with potential threat to life or bodily function.  Differential diagnosis includes, but is not limited to, psychosis, depression, anxiety, mania, suicidal ideation, homicidal ideation, substance abuse, medication noncompliance.  Patient well-appearing and in no  acute distress, vital signs are unremarkable.  She denies any medical complaints and screening labs show mild hypokalemia but no significant anemia, leukocytosis, or AKI.  LFTs are unremarkable, Tylenol  and salicylate levels are undetectable.  Patient may be medically cleared for psychiatric disposition, was placed under IVC after she became increasingly agitated and requesting to leave.  The patient has been placed in psychiatric observation due to the need to provide a safe environment for the patient while obtaining psychiatric consultation and evaluation, as well as ongoing medical and medication management to treat the patient's condition.  The patient has been placed under full IVC at this time.      FINAL CLINICAL IMPRESSION(S) / ED DIAGNOSES   Final diagnoses:  Hallucinations  Suicidal ideation     Rx / DC Orders   ED Discharge Orders     None        Note:  This document was prepared using Dragon voice recognition software and may include unintentional dictation errors.   Willo Dunnings, MD 11/26/23 2144

## 2023-11-26 NOTE — ED Notes (Signed)
 Patient arrived to the unit, headed to [facility] room 4. No acute needs at this time.

## 2023-11-26 NOTE — ED Notes (Signed)
 Pt given water

## 2023-11-26 NOTE — ED Notes (Signed)
 ED Provider at bedside.

## 2023-11-26 NOTE — ED Notes (Signed)
 Pt calm and cooperative sitting in bed at this time

## 2023-11-26 NOTE — ED Notes (Signed)
 Patient Belongings: Pink and Black Bonnet Pink Tshirt Green pants Black Jacket Camo Crock shoes 1 Purple Bra 1 Cellphone 2 rings

## 2023-11-26 NOTE — ED Notes (Signed)
 Pt standing up in hallway stating she wants to leave that she will just go home and die she no longer wants to be here. Pt asked to return to [facility] and EDP Willo is aware.

## 2023-11-26 NOTE — ED Notes (Signed)
 Pt repeatedly reporting she just wants to sleep, pt reports she has been unable to get sleep for the past 2 days and it is making her mental state worse.

## 2023-11-27 ENCOUNTER — Inpatient Hospital Stay
Admission: AD | Admit: 2023-11-27 | Discharge: 2023-12-02 | DRG: 897 | Disposition: A | Discharge status: Home / Self Care | Payer: Self-pay | Source: Intra-hospital | Attending: Psychiatry | Admitting: Psychiatry

## 2023-11-27 ENCOUNTER — Encounter: Payer: Self-pay | Admitting: Psychiatry

## 2023-11-27 DIAGNOSIS — R45851 Suicidal ideations: Secondary | ICD-10-CM | POA: Diagnosis present

## 2023-11-27 DIAGNOSIS — F14159 Cocaine abuse with cocaine-induced psychotic disorder, unspecified: Principal | ICD-10-CM | POA: Diagnosis present

## 2023-11-27 DIAGNOSIS — F4310 Post-traumatic stress disorder, unspecified: Secondary | ICD-10-CM | POA: Diagnosis present

## 2023-11-27 DIAGNOSIS — R45850 Homicidal ideations: Secondary | ICD-10-CM | POA: Diagnosis present

## 2023-11-27 DIAGNOSIS — Z818 Family history of other mental and behavioral disorders: Secondary | ICD-10-CM

## 2023-11-27 DIAGNOSIS — I10 Essential (primary) hypertension: Secondary | ICD-10-CM | POA: Diagnosis present

## 2023-11-27 DIAGNOSIS — F1910 Other psychoactive substance abuse, uncomplicated: Principal | ICD-10-CM | POA: Diagnosis present

## 2023-11-27 DIAGNOSIS — F319 Bipolar disorder, unspecified: Secondary | ICD-10-CM | POA: Diagnosis present

## 2023-11-27 DIAGNOSIS — F1914 Other psychoactive substance abuse with psychoactive substance-induced mood disorder: Secondary | ICD-10-CM | POA: Diagnosis not present

## 2023-11-27 DIAGNOSIS — F10139 Alcohol abuse with withdrawal, unspecified: Secondary | ICD-10-CM | POA: Diagnosis present

## 2023-11-27 DIAGNOSIS — F12159 Cannabis abuse with psychotic disorder, unspecified: Secondary | ICD-10-CM | POA: Diagnosis present

## 2023-11-27 DIAGNOSIS — F1994 Other psychoactive substance use, unspecified with psychoactive substance-induced mood disorder: Secondary | ICD-10-CM | POA: Insufficient documentation

## 2023-11-27 DIAGNOSIS — F17210 Nicotine dependence, cigarettes, uncomplicated: Secondary | ICD-10-CM | POA: Diagnosis present

## 2023-11-27 MED ORDER — HALOPERIDOL LACTATE 5 MG/ML IJ SOLN: 10.0000 mg | Freq: Three times a day (TID) | INTRAMUSCULAR | Status: DC | PRN

## 2023-11-27 MED ORDER — LORAZEPAM 2 MG/ML IJ SOLN: 2.0000 mg | Freq: Three times a day (TID) | INTRAMUSCULAR | Status: DC | PRN

## 2023-11-27 MED ORDER — CHLORDIAZEPOXIDE HCL 25 MG PO CAPS
25.0000 mg | ORAL_CAPSULE | ORAL | Status: AC
  Administered 2023-11-30 (×2): 25 mg via ORAL
  Filled 2023-11-27 (×2): qty 1

## 2023-11-27 MED ORDER — HALOPERIDOL 5 MG PO TABS
5.0000 mg | ORAL_TABLET | Freq: Three times a day (TID) | ORAL | Status: DC | PRN
  Administered 2023-12-02: 5 mg via ORAL
  Filled 2023-11-27 (×3): qty 1

## 2023-11-27 MED ORDER — LORAZEPAM 2 MG/ML IJ SOLN
2.0000 mg | Freq: Three times a day (TID) | INTRAMUSCULAR | Status: DC | PRN
  Filled 2023-11-27: qty 1

## 2023-11-27 MED ORDER — CHLORDIAZEPOXIDE HCL 25 MG PO CAPS
25.0000 mg | ORAL_CAPSULE | Freq: Four times a day (QID) | ORAL | Status: AC
  Administered 2023-11-27 – 2023-11-28 (×6): 25 mg via ORAL
  Filled 2023-11-27 (×6): qty 1

## 2023-11-27 MED ORDER — CHLORDIAZEPOXIDE HCL 25 MG PO CAPS
25.0000 mg | ORAL_CAPSULE | Freq: Four times a day (QID) | ORAL | Status: AC | PRN
  Administered 2023-11-30: 25 mg via ORAL
  Filled 2023-11-27: qty 1

## 2023-11-27 MED ORDER — ONDANSETRON 4 MG PO TBDP: 4.0000 mg | ORAL_TABLET | Freq: Four times a day (QID) | ORAL | Status: AC | PRN

## 2023-11-27 MED ORDER — ADULT MULTIVITAMIN W/MINERALS CH
1.0000 | ORAL_TABLET | Freq: Every day | ORAL | Status: DC
  Administered 2023-11-27 – 2023-12-02 (×6): 1 via ORAL
  Filled 2023-11-27 (×6): qty 1

## 2023-11-27 MED ORDER — TRAZODONE HCL 50 MG PO TABS
50.0000 mg | ORAL_TABLET | Freq: Every evening | ORAL | Status: DC | PRN
  Administered 2023-11-27 – 2023-12-01 (×4): 50 mg via ORAL
  Filled 2023-11-27 (×4): qty 1

## 2023-11-27 MED ORDER — CHLORDIAZEPOXIDE HCL 25 MG PO CAPS
25.0000 mg | ORAL_CAPSULE | Freq: Every day | ORAL | Status: AC
  Administered 2023-12-01: 25 mg via ORAL
  Filled 2023-11-27: qty 1

## 2023-11-27 MED ORDER — LOPERAMIDE HCL 2 MG PO CAPS: 2.0000 mg | ORAL_CAPSULE | ORAL | Status: AC | PRN

## 2023-11-27 MED ORDER — HYDROXYZINE HCL 25 MG PO TABS: 25.0000 mg | ORAL_TABLET | Freq: Four times a day (QID) | ORAL | Status: AC | PRN

## 2023-11-27 MED ORDER — THIAMINE HCL 100 MG/ML IJ SOLN
100.0000 mg | Freq: Once | INTRAMUSCULAR | Status: AC
  Administered 2023-11-27: 100 mg via INTRAMUSCULAR
  Filled 2023-11-27: qty 2

## 2023-11-27 MED ORDER — ACETAMINOPHEN 325 MG PO TABS: 650.0000 mg | ORAL_TABLET | Freq: Four times a day (QID) | ORAL | Status: DC | PRN

## 2023-11-27 MED ORDER — MAGNESIUM HYDROXIDE 400 MG/5ML PO SUSP: 30.0000 mL | Freq: Every day | ORAL | Status: DC | PRN

## 2023-11-27 MED ORDER — ALUM & MAG HYDROXIDE-SIMETH 200-200-20 MG/5ML PO SUSP: 30.0000 mL | ORAL | Status: DC | PRN

## 2023-11-27 MED ORDER — HYDROXYZINE HCL 25 MG PO TABS
25.0000 mg | ORAL_TABLET | Freq: Three times a day (TID) | ORAL | Status: DC | PRN
  Administered 2023-11-29 – 2023-12-01 (×4): 25 mg via ORAL
  Filled 2023-11-27 (×4): qty 1

## 2023-11-27 MED ORDER — CHLORDIAZEPOXIDE HCL 25 MG PO CAPS
25.0000 mg | ORAL_CAPSULE | Freq: Three times a day (TID) | ORAL | Status: AC
Start: 2023-11-29 — End: 2023-11-29
  Administered 2023-11-29 (×3): 25 mg via ORAL
  Filled 2023-11-27 (×2): qty 1

## 2023-11-27 MED ORDER — HALOPERIDOL LACTATE 5 MG/ML IJ SOLN
5.0000 mg | Freq: Three times a day (TID) | INTRAMUSCULAR | Status: DC | PRN
  Administered 2023-11-29: 5 mg via INTRAMUSCULAR
  Filled 2023-11-27: qty 1

## 2023-11-27 MED ORDER — DIPHENHYDRAMINE HCL 25 MG PO CAPS
50.0000 mg | ORAL_CAPSULE | Freq: Three times a day (TID) | ORAL | Status: DC | PRN
  Administered 2023-12-02: 50 mg via ORAL
  Filled 2023-11-27 (×2): qty 2

## 2023-11-27 MED ORDER — DIPHENHYDRAMINE HCL 50 MG/ML IJ SOLN
50.0000 mg | Freq: Three times a day (TID) | INTRAMUSCULAR | Status: DC | PRN
  Administered 2023-11-29: 50 mg via INTRAMUSCULAR

## 2023-11-27 MED ORDER — DIPHENHYDRAMINE HCL 50 MG/ML IJ SOLN
50.0000 mg | Freq: Three times a day (TID) | INTRAMUSCULAR | Status: DC | PRN
  Filled 2023-11-27: qty 1

## 2023-11-27 NOTE — Plan of Care (Signed)
   Problem: Safety: Goal: Periods of time without injury will increase Outcome: Progressing

## 2023-11-27 NOTE — ED Provider Notes (Signed)
 Patient accepted to [facility].   Lucille Crichlow, Josette SAILOR, DO 11/27/23 [phone]

## 2023-11-27 NOTE — BHH Counselor (Signed)
 CSW attempted twice to complete assessment with the patient.    Patient struggled with staying awake to complete her assessment.    CSW will attempt again later.  Sherryle Margo, MSW, LCSW 11/27/2023 1:27 PM
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
## 2023-11-27 NOTE — Progress Notes (Signed)
   11/27/23 1600  Psych Admission Type (Psych Patients Only)  Admission Status Involuntary  Psychosocial Assessment  Patient Complaints Malaise;Sleep disturbance  Eye Contact Brief  Facial Expression Other (Comment) (exhausted - lethargic)  Affect Other (Comment) (lethargic)  Speech Soft;Slow  Interaction Isolative  Motor Activity Lethargic  Appearance/Hygiene Disheveled;In scrubs  Behavior Characteristics Cooperative;Other (Comment) (lethargic)  Mood Pleasant  Thought Process  Coherency WDL  Content WDL  Delusions None reported or observed  Perception Hallucinations  Hallucination Auditory  Judgment Impaired  Confusion Mild  Danger to Self  Current suicidal ideation? Denies  Danger to Others  Danger to Others None reported or observed

## 2023-11-27 NOTE — Plan of Care (Signed)
  Problem: Education: Goal: Emotional status will improve Outcome: Progressing Goal: Mental status will improve Outcome: Progressing   Problem: Health Behavior/Discharge Planning: Goal: Compliance with treatment plan for underlying cause of condition will improve Outcome: Progressing   Problem: Physical Regulation: Goal: Ability to maintain clinical measurements within normal limits will improve Outcome: Not Progressing   Problem: Safety: Goal: Periods of time without injury will increase Outcome: Progressing

## 2023-11-27 NOTE — Group Note (Signed)
 Date:  11/27/2023 Time:  3:02 PM  Group Topic/Focus:  Wellness Toolbox:   The focus of this group is to discuss various aspects of wellness, balancing those aspects and exploring ways to increase the ability to experience wellness.  Patients will create a wellness toolbox for use upon discharge.    Participation Level:  Did Not Attend   Deitra Clap [facility] 11/27/2023, 3:02 PM

## 2023-11-27 NOTE — BH Assessment (Addendum)
 Patient is to be admitted to [facility] by Attending Physician will be Dr. Charlene Buba.   Patient has been assigned to room 323, by [facility] Charge Nurse Clarita.    ER staff is aware of the admission: Darryle, ER Secretary   Dr. Neomi, ER MD  Amy, Patient's Nurse  Zebedee, Patient Access.

## 2023-11-27 NOTE — Group Note (Signed)
 Date:  11/27/2023 Time:  9:02 PM  Group Topic/Focus:  Wrap-Up Group:   The focus of this group is to help patients review their daily goal of treatment and discuss progress on daily workbooks.    Participation Level:  Did Not Attend    Ellen Morrison Ellen Morrison 11/27/2023, 9:02 PM

## 2023-11-27 NOTE — Group Note (Signed)
 Date:  11/27/2023 Time:  11:03 AM  Group Topic/Focus:  Icebreaker Group: The focus of the group is to explain the functions and rules of the unit and also allow patients to introduce themselves to each other and name a positive feature about themselves.  Explains the roles and duties of every staff member on the milieu and how the each day will be structured for their care.     Participation Level:  Did Not Attend   Camellia HERO Tashira Torre 11/27/2023, 11:03 AM

## 2023-11-27 NOTE — Progress Notes (Signed)
   11/27/23 0300  Psych Admission Type (Psych Patients Only)  Admission Status Involuntary  Psychosocial Assessment  Patient Complaints Other (Comment) (Sleepy)  Eye Contact Brief  Facial Expression Other (Comment) (Sleepy)  Affect Other (Comment) (Sleepy)  Speech Soft  Interaction Minimal  Motor Activity Slow  Appearance/Hygiene In scrubs  Behavior Characteristics Cooperative  Mood Pleasant  Thought Process  Coherency WDL  Content WDL  Delusions None reported or observed  Perception WDL  Hallucination None reported or observed  Judgment Impaired  Confusion WDL  Danger to Self  Current suicidal ideation? Denies  Danger to Others  Danger to Others None reported or observed

## 2023-11-27 NOTE — Group Note (Signed)

## 2023-11-27 NOTE — ED Notes (Signed)
 Report called to karen rn b[facility] nurse.
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
## 2023-11-27 NOTE — Tx Team (Signed)
 Initial Treatment Plan 11/27/2023 2:56 AM Ellen Morrison Brain FMW:[id number]    PATIENT STRESSORS: Financial difficulties   Substance abuse     PATIENT STRENGTHS: Average or above average intelligence  Capable of independent living    PATIENT IDENTIFIED PROBLEMS: Substance Use Disorder  Bipolar  Financial                 DISCHARGE CRITERIA:  Improved stabilization in mood, thinking, and/or behavior  PRELIMINARY DISCHARGE PLAN: Outpatient therapy  PATIENT/FAMILY INVOLVEMENT: This treatment plan has been presented to and reviewed with the patient, Ellen Morrison, and/or family member, .  The patient and family have been given the opportunity to ask questions and make suggestions.  Joshua Hoose, RN 11/27/2023, 2:56 AM

## 2023-11-27 NOTE — BH IP Treatment Plan (Signed)
 Interdisciplinary Treatment and Diagnostic Plan Update  11/27/2023 Time of Session: 10:40 Ellen Morrison MRN: [id number]  Principal Diagnosis: Polysubstance abuse [facility])  Secondary Diagnoses: Principal Problem:   Polysubstance abuse (HCC)   Current Medications:  Current Facility-Administered Medications  Medication Dose Route Frequency Provider Last Rate Last Admin   acetaminophen  (TYLENOL ) tablet 650 mg  650 mg Oral Q6H PRN McLauchlin, Angela, NP       alum & mag hydroxide-simeth (MAALOX/MYLANTA) 200-200-20 MG/5ML suspension 30 mL  30 mL Oral Q4H PRN McLauchlin, Angela, NP       haloperidol  (HALDOL ) tablet 5 mg  5 mg Oral TID PRN McLauchlin, Angela, NP       And   diphenhydrAMINE  (BENADRYL ) capsule 50 mg  50 mg Oral TID PRN McLauchlin, Angela, NP       haloperidol  lactate (HALDOL ) injection 5 mg  5 mg Intramuscular TID PRN McLauchlin, Angela, NP       And   diphenhydrAMINE  (BENADRYL ) injection 50 mg  50 mg Intramuscular TID PRN McLauchlin, Angela, NP       And   LORazepam  (ATIVAN ) injection 2 mg  2 mg Intramuscular TID PRN McLauchlin, Jon, NP       haloperidol  lactate (HALDOL ) injection 10 mg  10 mg Intramuscular TID PRN McLauchlin, Angela, NP       And   diphenhydrAMINE  (BENADRYL ) injection 50 mg  50 mg Intramuscular TID PRN McLauchlin, Angela, NP       And   LORazepam  (ATIVAN ) injection 2 mg  2 mg Intramuscular TID PRN McLauchlin, Angela, NP       hydrOXYzine (ATARAX) tablet 25 mg  25 mg Oral TID PRN McLauchlin, Angela, NP       magnesium  hydroxide (MILK OF MAGNESIA) suspension 30 mL  30 mL Oral Daily PRN McLauchlin, Angela, NP       traZODone  (DESYREL ) tablet 50 mg  50 mg Oral QHS PRN McLauchlin, Angela, NP       PTA Medications: Medications Prior to Admission  Medication Sig Dispense Refill Last Dose/Taking   amLODipine  (NORVASC ) 2.5 MG tablet Take 1 tablet (2.5 mg total) by mouth daily. (Patient not taking: Reported on 04/12/2019) 30 tablet 0    doxycycline  (VIBRAMYCIN )  100 MG capsule Take 1 capsule (100 mg total) by mouth 2 (two) times daily. (Patient not taking: Reported on 04/12/2019) 28 capsule 0    FLUoxetine  (PROZAC ) 20 MG capsule Take 1 capsule (20 mg total) by mouth daily. (Patient not taking: Reported on 10/21/2018) 30 capsule 0    haloperidol  (HALDOL ) 0.5 MG tablet Take 1 tablet (0.5 mg total) by mouth 3 (three) times daily. (Patient not taking: Reported on 10/06/2014) 90 tablet 0    HYDROcodone -acetaminophen  (NORCO/VICODIN) 5-325 MG per tablet Take 1 tablet by mouth every 6 (six) hours as needed for moderate pain or severe pain. (Patient not taking: Reported on 10/21/2018) 8 tablet 0    hydrocortisone  (ANUSOL -HC) 2.5 % rectal cream Place 1 application rectally 2 (two) times daily. 30 g 0    ibuprofen  (ADVIL ,MOTRIN ) 800 MG tablet Take 1 tablet (800 mg total) by mouth 3 (three) times daily. (Patient not taking: Reported on 10/21/2018) 21 tablet 0    methocarbamol  (ROBAXIN ) 500 MG tablet Take 2 tablets (1,000 mg total) by mouth 2 (two) times daily. (Patient not taking: Reported on 04/12/2019) 20 tablet 0    nicotine  (NICOTROL ) 10 MG inhaler Inhale 1 cartridge (1 continuous puffing total) into the lungs as needed for smoking cessation. (Patient not taking:  Reported on 10/06/2014) 42 each 0    traZODone  (DESYREL ) 150 MG tablet Take 1 tablet (150 mg total) by mouth at bedtime as needed for sleep. (Patient not taking: Reported on 10/06/2014) 30 tablet 0     Patient Stressors: Financial difficulties   Substance abuse    Patient Strengths: Average or above average intelligence  Capable of independent living   Treatment Modalities: Medication Management, Group therapy, Case management,  1 to 1 session with clinician, Psychoeducation, Recreational therapy.   Physician Treatment Plan for Primary Diagnosis: Polysubstance abuse (HCC) Long Term Goal(s):     Short Term Goals:    Medication Management: Evaluate patient's response, side effects, and tolerance of medication  regimen.  Therapeutic Interventions: 1 to 1 sessions, Unit Group sessions and Medication administration.  Evaluation of Outcomes: Not Met  Physician Treatment Plan for Secondary Diagnosis: Principal Problem:   Polysubstance abuse (HCC)  Long Term Goal(s):     Short Term Goals:       Medication Management: Evaluate patient's response, side effects, and tolerance of medication regimen.  Therapeutic Interventions: 1 to 1 sessions, Unit Group sessions and Medication administration.  Evaluation of Outcomes: Not Met   RN Treatment Plan for Primary Diagnosis: Polysubstance abuse (HCC) Long Term Goal(s): Knowledge of disease and therapeutic regimen to maintain health will improve  Short Term Goals: Ability to remain free from injury will improve, Ability to verbalize frustration and anger appropriately will improve, Ability to demonstrate self-control, Ability to participate in decision making will improve, Ability to verbalize feelings will improve, Ability to disclose and discuss suicidal ideas, Ability to identify and develop effective coping behaviors will improve, and Compliance with prescribed medications will improve  Medication Management: RN will administer medications as ordered by provider, will assess and evaluate patient's response and provide education to patient for prescribed medication. RN will report any adverse and/or side effects to prescribing provider.  Therapeutic Interventions: 1 on 1 counseling sessions, Psychoeducation, Medication administration, Evaluate responses to treatment, Monitor vital signs and CBGs as ordered, Perform/monitor CIWA, COWS, AIMS and Fall Risk screenings as ordered, Perform wound care treatments as ordered.  Evaluation of Outcomes: Not Met   LCSW Treatment Plan for Primary Diagnosis: Polysubstance abuse (HCC) Long Term Goal(s): Safe transition to appropriate next level of care at discharge, Engage patient in therapeutic group addressing  interpersonal concerns.  Short Term Goals: Engage patient in aftercare planning with referrals and resources, Increase social support, Increase ability to appropriately verbalize feelings, Increase emotional regulation, Facilitate acceptance of mental health diagnosis and concerns, Facilitate patient progression through stages of change regarding substance use diagnoses and concerns, Identify triggers associated with mental health/substance abuse issues, and Increase skills for wellness and recovery  Therapeutic Interventions: Assess for all discharge needs, 1 to 1 time with Social worker, Explore available resources and support systems, Assess for adequacy in community support network, Educate family and significant other(s) on suicide prevention, Complete Psychosocial Assessment, Interpersonal group therapy.  Evaluation of Outcomes: Not Met   Progress in Treatment: Attending groups: No. Participating in groups: No. Taking medication as prescribed: Yes. Toleration medication: Yes. Family/Significant other contact made: No, will contact:  when given permission.  Patient understands diagnosis: No. Discussing patient identified problems/goals with staff: No. Medical problems stabilized or resolved: Yes. Denies suicidal/homicidal ideation: No. Issues/concerns per patient self-inventory: No. Other: none.  New problem(s) identified: No, Describe:  none identified.   New Short Term/Long Term Goal(s): medication management for mood stabilization; elimination of SI thoughts; development of comprehensive  mental wellness/sobriety plan.  Patient Goals:  Pt declined to participate in treatment team meeting.   Discharge Plan or Barriers: CSW will assist with development of an appropriate aftercare/discharge plan.   Reason for Continuation of Hospitalization: Anxiety Depression Medication stabilization Suicidal ideation  Estimated Length of Stay: 1-7 days  Last 3 [place] Suicide Severity Risk  Score: Flowsheet Row Admission (Current) from 11/27/2023 in [facility] INPATIENT BE[facility]AL MEDICINE ED from 11/26/2023 in [facility] Emergency Department at [facility] ED from 07/19/2020 in [facility] Emergency Department at [facility]  C-SSRS RISK CATEGORY Error: Q3, 4, or 5 should not be populated when Q2 is No High Risk No Risk    Last PHQ 2/9 Scores:     No data to display          Scribe for Treatment Team: Nadara JONELLE Fam, LCSW 11/27/2023 10:56 AM

## 2023-11-28 DIAGNOSIS — F1914 Other psychoactive substance abuse with psychoactive substance-induced mood disorder: Secondary | ICD-10-CM | POA: Diagnosis not present

## 2023-11-28 NOTE — Plan of Care (Signed)

## 2023-11-28 NOTE — Group Note (Signed)
 Recreation Therapy Group Note   Group Topic:Goal Setting  Group Date: 11/28/2023 Start Time: 1000 End Time: 1100 Facilitators: Celestia Jeoffrey BRAVO, LRT, CTRS Location: Craft Room  Group Description: Product/process Development Scientist. Patients were given many different magazines, a glue stick, markers, and a piece of cardstock paper. LRT and pts discussed the importance of having goals in life. LRT and pts discussed the difference between short-term and long-term goals, as well as what a SMART goal is. LRT encouraged pts to create a vision board, with images they picked and then cut out with safety scissors from the magazine, for themselves, that capture their short and long-term goals. LRT encouraged pts to show and explain their vision board to the group.   Goal Area(s) Addressed:  Patient will gain knowledge of short vs. long term goals.  Patient will identify goals for themselves. Patient will practice setting SMART goals.  Affect/Mood: N/A   Participation Level: Did not attend    Clinical Observations/Individualized Feedback: Patient did not attend group.   Plan: Continue to engage patient in RT group sessions 2-3x/week.   Jeoffrey BRAVO Celestia, LRT, CTRS 11/28/2023 1:22 PM
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
## 2023-11-28 NOTE — Group Note (Signed)
 [facility] LCSW Group Therapy Note   Group Date: 11/28/2023 Start Time: 1300 End Time: 1400   Type of Therapy/Topic:  Group Therapy:  Emotion Regulation  Participation Level:  Active   Mood: Appropriate   Description of Group:    The purpose of this group is to assist patients in learning to regulate negative emotions and experience positive emotions. Patients will be guided to discuss ways in which they have been vulnerable to their negative emotions. These vulnerabilities will be juxtaposed with experiences of positive emotions or situations, and patients challenged to use positive emotions to combat negative ones. Special emphasis will be placed on coping with negative emotions in conflict situations, and patients will process healthy conflict resolution skills.  Therapeutic Goals: Patient will identify two positive emotions or experiences to reflect on in order to balance out negative emotions:  Patient will label two or more emotions that they find the most difficult to experience:  Patient will be able to demonstrate positive conflict resolution skills through discussion or role plays:   Summary of Patient Progress:   During group, patient and group explored the ways in which our thoughts can impact our feelings which impacts our behaviors. Group along with facilitator completed a thermometer activity where different areas of life were explored. Participants were asked to notate in which zone these areas exist in on their personal thermometers. The group then discussed coping skills, and safety plans to help better prepare for potential stressors and learn to better emotionally regulate.      Therapeutic Modalities:   Cognitive Behavioral Therapy Feelings Identification Dialectical Behavioral Therapy   Ellen CHRISTELLA Kerns, LCSW
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
## 2023-11-28 NOTE — [facility] Suicide Risk Assessment (Signed)
 [facility] INPATIENT:  Family/Significant Other Suicide Prevention Education  Suicide Prevention Education:  Contact Attempts: Ellen Morrison, mom, [phone], (name of family member/significant other) has been identified by the patient as the family member/significant other with whom the patient will be residing, and identified as the person(s) who will aid the patient in the event of a mental health crisis.  With written consent from the patient, two attempts were made to provide suicide prevention education, prior to and/or following the patient's discharge.  We were unsuccessful in providing suicide prevention education.  A suicide education pamphlet was given to the patient to share with family/significant other.  Date and time of first attempt:11/28/2023 at 11:02AM Date and time of second attempt: Second attempt is needed.   CSW left HIPAA compliant voicemail.  Ellen Morrison 11/28/2023, 11:02 AM
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
## 2023-11-28 NOTE — Group Note (Signed)
 Date:  11/28/2023 Time:  8:54 PM  Group Topic/Focus:  Coping With Mental Health Crisis:   The purpose of this group is to help patients identify strategies for coping with mental health crisis.  Group discusses possible causes of crisis and ways to manage them effectively.    Pt did not attend group.  Ellen Morrison L 11/28/2023, 8:54 PM
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
## 2023-11-28 NOTE — Plan of Care (Signed)
   Problem: Education: Goal: Knowledge of Ellen Morrison [facility] Education information/materials will improve Outcome: Progressing Goal: Emotional status will improve Outcome: Progressing Goal: Mental status will improve Outcome: Progressing Goal: Verbalization of understanding the information provided will improve Outcome: Progressing   Problem: Activity: Goal: Interest or engagement in activities will improve Outcome: Progressing Goal: Sleeping patterns will improve Outcome: Progressing   Problem: Coping: Goal: Ability to verbalize frustrations and anger appropriately will improve Outcome: Progressing Goal: Ability to demonstrate self-control will improve Outcome: Progressing

## 2023-11-29 DIAGNOSIS — F1994 Other psychoactive substance use, unspecified with psychoactive substance-induced mood disorder: Secondary | ICD-10-CM

## 2023-11-29 DIAGNOSIS — F1914 Other psychoactive substance abuse with psychoactive substance-induced mood disorder: Secondary | ICD-10-CM | POA: Diagnosis not present

## 2023-11-29 MED ORDER — OLANZAPINE 5 MG PO TABS
5.0000 mg | ORAL_TABLET | Freq: Two times a day (BID) | ORAL | Status: DC
  Administered 2023-11-30: 5 mg via ORAL
  Filled 2023-11-29: qty 1

## 2023-11-29 MED ORDER — INFLUENZA VIRUS VACC SPLIT PF (FLUZONE) 0.5 ML IM SUSY
0.5000 mL | PREFILLED_SYRINGE | INTRAMUSCULAR | Status: DC
Start: 2023-11-30 — End: 2023-12-02
  Filled 2023-11-29: qty 0.5

## 2023-11-29 MED ORDER — DIVALPROEX SODIUM ER 500 MG PO TB24
500.0000 mg | ORAL_TABLET | Freq: Every day | ORAL | Status: DC
  Administered 2023-11-30 – 2023-12-01 (×2): 500 mg via ORAL
  Filled 2023-11-29 (×2): qty 1

## 2023-11-29 NOTE — Plan of Care (Signed)
  Problem: Education: Goal: Emotional status will improve Outcome: Progressing Goal: Mental status will improve Outcome: Progressing   Problem: Activity: Goal: Sleeping patterns will improve Outcome: Not Progressing   

## 2023-11-29 NOTE — Plan of Care (Signed)
  Problem: Education: Goal: Knowledge of [facility] Education information/materials will improve Outcome: Not Progressing Goal: Mental status will improve Outcome: Not Met (add Reason) Goal: Verbalization of understanding the information provided will improve Outcome: Progressing   Problem: Activity: Goal: Interest or engagement in activities will improve Outcome: Not Progressing   Problem: Activity: Goal: Interest or engagement in activities will improve Outcome: Not Progressing   Problem: Safety: Goal: Ability to remain free from injury will improve Outcome: Not Met (add Reason)

## 2023-11-29 NOTE — Group Note (Signed)
 Date:  11/29/2023 Time:  10:31 AM  Group Topic/Focus:  Goals Group:   The focus of this group is to help patients establish daily goals to achieve during treatment and discuss how the patient can incorporate goal setting into their daily lives to aide in recovery.    Participation Level:  Active  Participation Quality:  Appropriate  Affect:  Appropriate  Cognitive:  Alert  Insight: Appropriate  Engagement in Group:  Engaged  Modes of Intervention:  Activity, Discussion, and Education  Additional Comments:    Skippy LITTIE Bennett 11/29/2023, 10:31 AM
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
## 2023-11-29 NOTE — Progress Notes (Signed)
 EKG completed and placed on chart, patient is requesting stronger medication for sleep. Up around 3 am and 5 am. Vistaril given.

## 2023-11-29 NOTE — [facility] Suicide Risk Assessment (Signed)
 [facility] INPATIENT:  Family/Significant Other Suicide Prevention Education  Suicide Prevention Education:  Contact Attempts: Arna Bihari, mom, [phone];, (name of family member/significant other) has been identified by the patient as the family member/significant other with whom the patient will be residing, and identified as the person(s) who will aid the patient in the event of a mental health crisis.  With written consent from the patient, two attempts were made to provide suicide prevention education, prior to and/or following the patient's discharge.  We were unsuccessful in providing suicide prevention education.  A suicide education pamphlet was given to the patient to share with family/significant other.  Date and time of first attempt: 11/28/2023 at 11:02AM  Date and time of second attempt: 11/29/2023 at 4:05PM  Sherryle JINNY Margo 11/29/2023, 4:04 PM

## 2023-11-29 NOTE — Group Note (Signed)
 Recreation Therapy Group Note   Group Topic:Leisure Education  Group Date: 11/29/2023 Start Time: 1030 End Time: 1140 Facilitators: Celestia Jeoffrey BRAVO, LRT, CTRS Location: Craft Room  Group Description: Leisure. Patients were given the option to choose from journaling, coloring, drawing, making origami, playing with playdoh, listening to music or singing karaoke. LRT and pts discussed the meaning of leisure, the importance of participating in leisure during their free time/when they're outside of the hospital, as well as how our leisure interests can also serve as coping skills.   Goal Area(s) Addressed:  Patient will identify a current leisure interest.  Patient will learn the definition of "leisure". Patient will practice making a positive decision. Patient will have the opportunity to try a new leisure activity. Patient will communicate with peers and LRT.    Affect/Mood: N/A   Participation Level: Did not attend    Clinical Observations/Individualized Feedback: Patient did not attend group.   Plan: Continue to engage patient in RT group sessions 2-3x/week.   Jeoffrey BRAVO Celestia, LRT, CTRS 11/29/2023 1:36 PM

## 2023-11-29 NOTE — Group Note (Signed)
 Date:  11/29/2023 Time:  9:08 PM  Group Topic/Focus:  Wellness Toolbox:   The focus of this group is to discuss various aspects of wellness, balancing those aspects and exploring ways to increase the ability to experience wellness.  Patients will create a wellness toolbox for use upon discharge.    Participation Level:  Active  Participation Quality:  Appropriate, Attentive, Sharing, and Supportive  Affect:  Appropriate  Cognitive:  Appropriate  Insight: Appropriate and Good  Engagement in Group:  Engaged  Modes of Intervention:  Discussion  Additional Comments:     Kerri Katz 11/29/2023, 9:08 PM
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
## 2023-11-30 MED ORDER — OLANZAPINE 10 MG PO TABS
10.0000 mg | ORAL_TABLET | Freq: Every day | ORAL | Status: DC
  Administered 2023-11-30: 10 mg via ORAL
  Filled 2023-11-30: qty 1

## 2023-11-30 MED ORDER — NICOTINE 21 MG/24HR TD PT24
21.0000 mg | MEDICATED_PATCH | Freq: Every day | TRANSDERMAL | Status: DC
  Administered 2023-11-30 – 2023-12-02 (×3): 21 mg via TRANSDERMAL
  Filled 2023-11-30 (×3): qty 1

## 2023-11-30 NOTE — Group Note (Deleted)
 Date:  11/30/2023 Time:  1:57 PM  Group Topic/Focus:  Building Self Esteem:   The Focus of this group is helping patients become aware of the effects of self-esteem on their lives, the things they and others do that enhance or undermine their self-esteem, seeing the relationship between their level of self-esteem and the choices they make and learning ways to enhance self-esteem.     Participation Level:  {BHH PARTICIPATION OZCZO:77735}  Participation Quality:  {BHH PARTICIPATION QUALITY:22265}  Affect:  {BHH AFFECT:22266}  Cognitive:  {BHH COGNITIVE:22267}  Insight: {BHH Insight2:20797}  Engagement in Group:  {BHH ENGAGEMENT IN HMNLE:77731}  Modes of Intervention:  {BHH MODES OF INTERVENTION:22269}  Additional Comments:  ***  Deitra Caron Mainland 11/30/2023, 1:57 PM

## 2023-11-30 NOTE — Group Note (Signed)
 Date:  11/30/2023 Time:  10:53 PM    Additional Comments:  Did not attend group.  Butler LITTIE Gelineau 11/30/2023, 10:53 PM

## 2023-11-30 NOTE — Group Note (Signed)
 Date:  11/30/2023 Time:  6:19 PM  Group Topic/Focus:  Wellness Toolbox:   The focus of this group is to discuss various aspects of wellness, balancing those aspects and exploring ways to increase the ability to experience wellness.  Patients will create a wellness toolbox for use upon discharge.    Participation Level:  Active  Participation Quality:  Appropriate  Affect:  Appropriate  Cognitive:  Appropriate  Insight: Appropriate  Engagement in Group:  Engaged  Modes of Intervention:  Activity and Socialization  Additional Comments:    Ellen Morrison 11/30/2023, 6:19 PM

## 2023-11-30 NOTE — Group Note (Signed)
 Date:  11/30/2023 Time:  7:12 AM  Group Topic/Focus:  Activity Group: The focus of the group is to promote activity for the patients and encourage them to go outside to the courtyard and get some fresh air and exercise.    Participation Level:  Did Not Attend   [name] 11/30/2023, 7:12 AM

## 2023-11-30 NOTE — Progress Notes (Signed)
   11/30/23 0845  Psych Admission Type (Psych Patients Only)  Admission Status Involuntary  Psychosocial Assessment  Patient Complaints Depression  Eye Contact Brief  Facial Expression Sad  Affect Depressed  Speech Logical/coherent;Soft  Interaction Minimal  Motor Activity Slow  Appearance/Hygiene In scrubs  Behavior Characteristics Unwilling to participate  Mood Depressed  Thought Process  Coherency WDL  Content WDL  Delusions None reported or observed  Perception WDL  Hallucination None reported or observed  Judgment Impaired  Confusion None  Danger to Self  Current suicidal ideation? Denies  Agreement Not to Harm Self Yes  Description of Agreement verbal  Danger to Others  Danger to Others None reported or observed

## 2023-11-30 NOTE — Group Note (Signed)
 Date:  11/30/2023 Time:  2:10 PM  Group Topic/Focus:  Goals Group:   The focus of this group is to help patients establish daily goals to achieve during treatment and discuss how the patient can incorporate goal setting into their daily lives to aide in recovery. Wellness Toolbox:   The focus of this group is to discuss various aspects of wellness, balancing those aspects and exploring ways to increase the ability to experience wellness.  Patients will create a wellness toolbox for use upon discharge.    Participation Level:  Did Not Attend   Ellen Morrison [facility] 11/30/2023, 2:10 PM

## 2023-11-30 NOTE — Plan of Care (Signed)
 Ellen Morrison is a 43 y.o. female patient. No diagnosis found. Past Medical History:  Diagnosis Date   Bipolar disease, manic (HCC)    Cannabis use disorder, severe, dependence (HCC) 06/09/2014   Preeclampsia 7 years ago    with first pregnancy   Stimulant use disorder (cocaine) moderate 06/09/2014   Current Facility-Administered Medications  Medication Dose Route Frequency Provider Last Rate Last Admin   acetaminophen  (TYLENOL ) tablet 650 mg  650 mg Oral Q6H PRN McLauchlin, Angela, NP       alum & mag hydroxide-simeth (MAALOX/MYLANTA) 200-200-20 MG/5ML suspension 30 mL  30 mL Oral Q4H PRN McLauchlin, Angela, NP       chlordiazePOXIDE (LIBRIUM) capsule 25 mg  25 mg Oral Q6H PRN Jadapalle, Sree, MD       chlordiazePOXIDE (LIBRIUM) capsule 25 mg  25 mg Oral BH-qamhs Jadapalle, Sree, MD       Followed by   NOREEN ON 12/01/2023] chlordiazePOXIDE (LIBRIUM) capsule 25 mg  25 mg Oral Daily Jadapalle, Sree, MD       haloperidol  (HALDOL ) tablet 5 mg  5 mg Oral TID PRN McLauchlin, Angela, NP       And   diphenhydrAMINE  (BENADRYL ) capsule 50 mg  50 mg Oral TID PRN McLauchlin, Angela, NP       haloperidol  lactate (HALDOL ) injection 5 mg  5 mg Intramuscular TID PRN McLauchlin, Angela, NP   5 mg at 11/29/23 1407   And   diphenhydrAMINE  (BENADRYL ) injection 50 mg  50 mg Intramuscular TID PRN McLauchlin, Jon, NP       And   LORazepam  (ATIVAN ) injection 2 mg  2 mg Intramuscular TID PRN McLauchlin, Jon, NP       haloperidol  lactate (HALDOL ) injection 10 mg  10 mg Intramuscular TID PRN McLauchlin, Jon, NP       And   diphenhydrAMINE  (BENADRYL ) injection 50 mg  50 mg Intramuscular TID PRN McLauchlin, Angela, NP   50 mg at 11/29/23 1407   And   LORazepam  (ATIVAN ) injection 2 mg  2 mg Intramuscular TID PRN McLauchlin, Angela, NP       divalproex (DEPAKOTE ER) 24 hr tablet 500 mg  500 mg Oral QHS Jadapalle, Sree, MD       hydrOXYzine (ATARAX) tablet 25 mg  25 mg Oral TID PRN McLauchlin, Angela, NP    25 mg at 11/29/23 2133   hydrOXYzine (ATARAX) tablet 25 mg  25 mg Oral Q6H PRN Jadapalle, Sree, MD       influenza vac split trivalent PF (FLUZONE) injection 0.5 mL  0.5 mL Intramuscular Tomorrow-1000 Jalandhara, Vinitkumar, MD       loperamide (IMODIUM) capsule 2-4 mg  2-4 mg Oral PRN Jadapalle, Sree, MD       magnesium  hydroxide (MILK OF MAGNESIA) suspension 30 mL  30 mL Oral Daily PRN McLauchlin, Angela, NP       multivitamin with minerals tablet 1 tablet  1 tablet Oral Daily Jadapalle, Sree, MD   1 tablet at 11/29/23 0845   OLANZapine  (ZYPREXA ) tablet 5 mg  5 mg Oral BID Jadapalle, Sree, MD       ondansetron  (ZOFRAN -ODT) disintegrating tablet 4 mg  4 mg Oral Q6H PRN Jadapalle, Sree, MD       traZODone  (DESYREL ) tablet 50 mg  50 mg Oral QHS PRN McLauchlin, Angela, NP   50 mg at 11/29/23 2133   Allergies  Allergen Reactions   Geodon [Ziprasidone Hcl] Other (See Comments)    Pt states that is  causes her neck to twist   Penicillins Other (See Comments)    Yeast infections Has patient had a PCN reaction causing immediate rash, facial/tongue/throat swelling, SOB or lightheadedness with hypotension: NO Has patient had a PCN reaction causing severe rash involving mucus membranes or skin necrosis: NO Has patient had a PCN reaction that required hospitalization NO Has patient had a PCN reaction occurring within the last 10 years: YES If all of the above answers are NO, then may proceed with Cephalosporin use.   Principal Problem:   Polysubstance abuse (HCC) Active Problems:   Substance induced mood disorder (HCC)  Blood pressure (!) 125/95, pulse 92, temperature 97.9 F (36.6 C), temperature source Oral, resp. rate 14, height 5' 3 (1.6 m), weight 89.4 kg, last menstrual period 11/17/2023, SpO2 97%.  Subjective Objective Assessment & Plan  Ellen Morrison 11/30/2023

## 2023-11-30 NOTE — Plan of Care (Signed)
   Problem: Education: Goal: Emotional status will improve Outcome: Progressing Goal: Mental status will improve Outcome: Progressing   Problem: Activity: Goal: Interest or engagement in activities will improve Outcome: Progressing Goal: Sleeping patterns will improve Outcome: Progressing

## 2023-12-01 MED ORDER — OLANZAPINE 5 MG PO TABS
5.0000 mg | ORAL_TABLET | Freq: Every day | ORAL | Status: DC
  Administered 2023-12-01: 5 mg via ORAL
  Filled 2023-12-01: qty 1

## 2023-12-01 MED ORDER — NICOTINE POLACRILEX 2 MG MT GUM
2.0000 mg | CHEWING_GUM | OROMUCOSAL | Status: DC | PRN
Start: 2023-12-01 — End: 2023-12-02
  Filled 2023-12-01: qty 1

## 2023-12-01 NOTE — Plan of Care (Signed)

## 2023-12-01 NOTE — BHH Suicide Risk Assessment (Signed)
 BHH INPATIENT:  Family/Significant Other Suicide Prevention Education  Suicide Prevention Education:  Education Completed; Arna Shlomo hick [phone]  (name of family member/significant other) has been identified by the patient as the family member/significant other with whom the patient will be residing, and identified as the person(s) who will aid the patient in the event of a mental health crisis (suicidal ideations/suicide attempt).  With written consent from the patient, the family member/significant other has been provided the following suicide prevention education, prior to the and/or following the discharge of the patient.  The suicide prevention education provided includes the following: Suicide risk factors Suicide prevention and interventions National Suicide Hotline telephone number [facility] assessment telephone number [facility] Emergency Assistance 911 [facility] and/or Residential Mobile Crisis Unit telephone number  Request made of family/significant other to: Remove weapons (e.g., guns, rifles, knives), all items previously/currently identified as safety concern.   Remove drugs/medications (over-the-counter, prescriptions, illicit drugs), all items previously/currently identified as a safety concern.  The family member/significant other verbalizes understanding of the suicide prevention education information provided.  The family member/significant other agrees to remove the items of safety concern listed above.  Aldo HERO Vaanya Shambaugh 12/01/2023, 12:33 PM

## 2023-12-01 NOTE — Group Note (Signed)
 Date:  12/01/2023 Time:  8:39 PM  Group Topic/Focus:  Wellness Toolbox:   The focus of this group is to discuss various aspects of wellness, balancing those aspects and exploring ways to increase the ability to experience wellness.  Patients will create a wellness toolbox for use upon discharge. Wrap-Up Group:   The focus of this group is to help patients review their daily goal of treatment and discuss progress on daily workbooks.    Participation Level:  Active  Participation Quality:  Appropriate and Attentive  Affect:  Appropriate  Cognitive:  Alert, Appropriate, and Oriented  Insight: Appropriate and Good  Engagement in Group:  Engaged  Modes of Intervention:  Discussion and Support  Additional Comments:  N/A  Ellen Morrison 12/01/2023, 8:39 PM

## 2023-12-01 NOTE — Plan of Care (Signed)
   Problem: Education: Goal: Emotional status will improve Outcome: Progressing Goal: Mental status will improve Outcome: Progressing

## 2023-12-01 NOTE — BHH Suicide Risk Assessment (Signed)
 BHH INPATIENT:  Family/Significant Other Suicide Prevention Education  Suicide Prevention Education:  Education Completed; Arna Shlomo hick [phone]  (name of family member/significant other) has been identified by the patient as the family member/significant other with whom the patient will be residing, and identified as the person(s) who will aid the patient in the event of a mental health crisis (suicidal ideations/suicide attempt).  With written consent from the patient, the family member/significant other has been provided the following suicide prevention education, prior to the and/or following the discharge of the patient.  The suicide prevention education provided includes the following: Suicide risk factors Suicide prevention and interventions National Suicide Hotline telephone number [facility] assessment telephone number [facility] Emergency Assistance 911 [facility] and/or Residential Mobile Crisis Unit telephone number  Request made of family/significant other to: Remove weapons (e.g., guns, rifles, knives), all items previously/currently identified as safety concern.   Remove drugs/medications (over-the-counter, prescriptions, illicit drugs), all items previously/currently identified as a safety concern.  The family member/significant other verbalizes understanding of the suicide prevention education information provided.  The family member/significant other agrees to remove the items of safety concern listed above.  Aldo HERO Naythan Douthit 12/01/2023, 4:20 PM

## 2023-12-01 NOTE — Progress Notes (Deleted)
   12/01/23 0830  Psychosocial Assessment  Patient Complaints None  Eye Contact Brief  Facial Expression Sad  Affect Depressed  Speech Logical/coherent;Soft  Interaction Minimal  Motor Activity Slow  Appearance/Hygiene In scrubs  Behavior Characteristics Appropriate to situation  Mood Depressed  Thought Process  Coherency WDL  Content WDL  Delusions None reported or observed  Perception WDL  Hallucination None reported or observed  Judgment Impaired  Confusion None  Danger to Self  Current suicidal ideation? Denies  Agreement Not to Harm Self Yes  Description of Agreement verbal  Danger to Others  Danger to Others None reported or observed

## 2023-12-01 NOTE — Progress Notes (Signed)
   12/01/23 0830  Psych Admission Type (Psych Patients Only)  Admission Status Involuntary  Psychosocial Assessment  Patient Complaints None  Eye Contact Brief  Facial Expression Sad  Affect Depressed  Speech Logical/coherent;Soft  Interaction Minimal  Motor Activity Slow  Appearance/Hygiene In scrubs  Behavior Characteristics Appropriate to situation  Mood Depressed  Thought Process  Coherency WDL  Content WDL  Delusions None reported or observed  Perception WDL  Hallucination None reported or observed  Judgment Impaired  Confusion None  Danger to Self  Current suicidal ideation? Denies  Agreement Not to Harm Self Yes  Description of Agreement verbal  Danger to Others  Danger to Others None reported or observed
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
## 2023-12-01 NOTE — Progress Notes (Signed)
   11/30/23 2034  Psych Admission Type (Psych Patients Only)  Admission Status Involuntary  Psychosocial Assessment  Patient Complaints None  Eye Contact Brief  Facial Expression Flat;Sad  Affect Depressed  Speech Logical/coherent  Interaction Minimal  Motor Activity Other (Comment) (WDL)  Appearance/Hygiene Unremarkable  Behavior Characteristics Appropriate to situation  Mood Depressed  Thought Process  Coherency WDL  Content WDL  Delusions None reported or observed  Perception WDL  Hallucination None reported or observed  Judgment Limited  Confusion None  Danger to Self  Current suicidal ideation? Denies  Agreement Not to Harm Self Yes  Description of Agreement Verbal  Danger to Others  Danger to Others None reported or observed   Pt OOB and visible in milieu. Endorsed depression rated moderate.  Denies SI/HI and AVH.  Pt indicated that she wants to be discharged to go and take care  of her children 12 and 53 y.o respectively; noting that they are alone.  Later during the shift, Pt came our of her room tearful that she does not understand why she is IVC.  Support provide  She remains care compliant.

## 2023-12-02 ENCOUNTER — Other Ambulatory Visit: Payer: Self-pay

## 2023-12-02 DIAGNOSIS — F1914 Other psychoactive substance abuse with psychoactive substance-induced mood disorder: Secondary | ICD-10-CM | POA: Diagnosis not present

## 2023-12-02 MED ORDER — OLANZAPINE 5 MG PO TABS
5.0000 mg | ORAL_TABLET | Freq: Every day | ORAL | 0 refills | Status: AC
  Filled 2023-12-02: qty 30, 30d supply, fill #0

## 2023-12-02 MED ORDER — DIVALPROEX SODIUM ER 500 MG PO TB24
500.0000 mg | ORAL_TABLET | Freq: Every day | ORAL | 0 refills | Status: AC
  Filled 2023-12-02: qty 30, 30d supply, fill #0
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
## 2023-12-02 NOTE — Progress Notes (Signed)
 Discharge Note:  Patient denies SI/HI/AVH at this time. Discharge instructions, AVS, prescriptions, and transition recor gone over with patient. Patient agrees to comply with medication management, follow-up visit, and outpatient therapy. Patient belongings returned to patient. Patient questions and concerns addressed and answered. Patient ambulatory off unit. Patient discharged to home with parents. 30 day supply medications provide to patient

## 2023-12-02 NOTE — BHH Suicide Risk Assessment (Signed)
 [facility] Discharge Suicide Risk Assessment   Principal Problem: Polysubstance abuse [facility]) Discharge Diagnoses: Principal Problem:   Polysubstance abuse (HCC) Active Problems:   Substance induced mood disorder (HCC)   Total Time spent with patient: 30 minutes  Musculoskeletal: Strength & Muscle Tone: within normal limits Gait & Station: normal Patient leans: N/A  Psychiatric Specialty Exam  Presentation  General Appearance:  Appropriate for Environment; Casual  Eye Contact: Fair  Speech: Clear and Coherent  Speech Volume: Normal  Handedness:No data recorded  Mood and Affect  Mood: Euthymic  Duration of Depression Symptoms: Greater than two weeks  Affect: Appropriate   Thought Process  Thought Processes: Coherent  Descriptions of Associations:Intact  Orientation:Full (Time, Place and Person)  Thought Content:Logical  History of Schizophrenia/Schizoaffective disorder:No  Duration of Psychotic Symptoms:Greater than six months  Hallucinations:Hallucinations: None  Ideas of Reference:None  Suicidal Thoughts:Suicidal Thoughts: No  Homicidal Thoughts:Homicidal Thoughts: No   Sensorium  Memory: Immediate Fair; Remote Fair  Judgment: Fair  Insight: Fair   Art Therapist  Concentration: Fair  Attention Span: Fair  Recall: [employer] of Knowledge: Fair  Language: Fair   Psychomotor Activity  Psychomotor Activity: Psychomotor Activity: Normal   Assets  Assets: Communication Skills; Desire for Improvement; Social Support   Sleep  Sleep: Sleep: Fair  Estimated Sleeping Duration (Last 24 Hours): 7.50-10.00 hours (Due to Daylight Saving Time, the durations displayed may not accurately represent documentation during the time change interval)  Physical Exam: Physical Exam ROS Blood pressure (!) 131/91, pulse 99, temperature 97.9 F (36.6 C), temperature source Oral, resp. rate 14, height 5' 3 (1.6 m), weight 89.4 kg,  last menstrual period 11/17/2023, SpO2 100%. Body mass index is 34.9 kg/m.  Mental Status Per Nursing Assessment::   On Admission:  NA  Demographic Factors:  Low socioeconomic status  Loss Factors: Decrease in vocational status  Historical Factors: Impulsivity  Risk Reduction Factors:   Living with another person, especially a relative, Positive social support, Positive therapeutic relationship, and Positive coping skills or problem solving skills  Continued Clinical Symptoms:  Bipolar Disorder:   Depressive phase  Cognitive Features That Contribute To Risk:  None    Suicide Risk:  Minimal: No identifiable suicidal ideation.  Patients presenting with no risk factors but with morbid ruminations; may be classified as minimal risk based on the severity of the depressive symptoms    Plan Of Care/Follow-up recommendations:  Activity:  as tolerated  Allyn Foil, MD 12/02/2023, 12:00 PM

## 2023-12-02 NOTE — BHH Counselor (Signed)
 CSW spoke with patient's father, Norleen Bihari, [phone].  He reports no safety concerns for patient coming to the home with he and patients mother.    He reports no weapons in the home.   He reports that he thinks patient is doing well.   Sherryle Margo, MSW, LCSW 12/02/2023 4:04 PM

## 2023-12-02 NOTE — Group Note (Signed)
 LCSW Group Therapy Note  Group Date: 12/02/2023 Start Time: 1300 End Time: 1400   Type of Therapy and Topic:  Group Therapy - Healthy vs Unhealthy Coping Skills  Participation Level:  None   Description of Group The focus of this group was to determine what unhealthy coping techniques typically are used by group members and what healthy coping techniques would be helpful in coping with various problems. Patients were guided in becoming aware of the differences between healthy and unhealthy coping techniques. Patients were asked to identify 2-3 healthy coping skills they would like to learn to use more effectively.  Therapeutic Goals Patients learned that coping is what human beings do all day long to deal with various situations in their lives Patients defined and discussed healthy vs unhealthy coping techniques Patients identified their preferred coping techniques and identified whether these were healthy or unhealthy Patients determined 2-3 healthy coping skills they would like to become more familiar with and use more often. Patients provided support and ideas to each other   Summary of Patient Progress:   Patient was present in group, however, did not engage in group discussion.   Therapeutic Modalities Cognitive Behavioral Therapy Motivational Interviewing  Ellen Morrison, Ellen Morrison 12/02/2023  3:05 PM

## 2023-12-02 NOTE — Plan of Care (Signed)
   Problem: Education: Goal: Emotional status will improve Outcome: Progressing

## 2023-12-02 NOTE — Progress Notes (Deleted)
 Mother's  contact [phone]

## 2023-12-02 NOTE — Progress Notes (Signed)
   12/01/23 2033  Psych Admission Type (Psych Patients Only)  Admission Status Involuntary  Psychosocial Assessment  Patient Complaints None  Eye Contact Brief  Facial Expression Animated  Affect Depressed  Speech Logical/coherent  Interaction Assertive  Motor Activity Other (Comment) (WDL)  Appearance/Hygiene Unremarkable  Behavior Characteristics Appropriate to situation;Cooperative  Mood Depressed  Thought Process  Coherency WDL  Content WDL  Delusions None reported or observed  Perception WDL  Hallucination None reported or observed  Judgment Limited  Confusion None  Danger to Self  Current suicidal ideation? Denies  Agreement Not to Harm Self Yes  Description of Agreement Verbal  Danger to Others  Danger to Others None reported or observed   Pt was out in the milieu and noted interacting with peers.  Took all scheduled medications including Trazodone .  At about 2258 Pt OOB reporting anxiety, Atarax given.  At about 0200 hours, Pt OOB tearful, irritable and screaming I told the doctor I can't sleep and that the trazodone  is not helping me.  Support provided Haldol  and Benadryl  given.  Pt returned to room and has since appeared to be asleep.

## 2023-12-02 NOTE — Group Note (Signed)
 Recreation Therapy Group Note   Group Topic:General Recreation  Group Date: 12/02/2023 Start Time: 1030 End Time: 1120 Facilitators: Celestia Jeoffrey BRAVO, LRT, CTRS Location: Courtyard  Group Description: [employer]. LRT and patients played games of basketball, drew with chalk, and played corn hole while outside in the courtyard while getting fresh air and sunlight. Music was being played in the background. LRT and peers conversed about different games they have played before, what they do in their free time and anything else that is on their minds. LRT encouraged pts to drink water after being outside, sweating and getting their heart rate up.  Goal Area(s) Addressed: Patient will build on frustration tolerance skills. Patients will partake in a competitive play game with peers. Patients will gain knowledge of new leisure interest/hobby.    Affect/Mood: N/A   Participation Level: Did not attend    Clinical Observations/Individualized Feedback: Patient did not attend group.   Plan: Continue to engage patient in RT group sessions 2-3x/week.   Jeoffrey BRAVO Celestia, LRT, CTRS 12/02/2023 11:48 AM

## 2023-12-02 NOTE — Progress Notes (Signed)
   12/02/23 0900  Psych Admission Type (Psych Patients Only)  Admission Status Involuntary  Psychosocial Assessment  Patient Complaints Anxiety;Worrying (wanting to D/C states she needs to take care of her kids)  Eye Contact Intense  Facial Expression Animated;Sad (tearful when takling about her kids)  Affect Anxious;Sad  Speech Logical/coherent  Interaction Assertive  Motor Activity Other (Comment) (appropriate)  Appearance/Hygiene Unremarkable  Behavior Characteristics Cooperative;Anxious  Mood Preoccupied  Thought Process  Coherency WDL  Content Preoccupation (with leaving and getting home to kids)  Delusions None reported or observed  Perception WDL  Hallucination None reported or observed  Judgment Limited  Confusion None  Danger to Self  Current suicidal ideation? Denies  Agreement Not to Harm Self Yes  Description of Agreement verbal

## 2023-12-02 NOTE — BHH Counselor (Signed)
 CSW has called the patient's father, Norleen Bihari, [phone] and mother, Arna Bihari, [phone], multiple times each.  CSW has left multiple HIPAA compliant voicemails.    Sherryle Margo, MSW, LCSW 12/02/2023 3:31 PM

## 2023-12-02 NOTE — Plan of Care (Signed)
   Problem: Education: Goal: Knowledge of Ellen Morrison [facility] Education information/materials will improve Outcome: Progressing Goal: Emotional status will improve Outcome: Progressing Goal: Mental status will improve Outcome: Progressing Goal: Verbalization of understanding the information provided will improve Outcome: Progressing   Problem: Activity: Goal: Interest or engagement in activities will improve Outcome: Progressing Goal: Sleeping patterns will improve Outcome: Progressing   Problem: Coping: Goal: Ability to verbalize frustrations and anger appropriately will improve Outcome: Progressing Goal: Ability to demonstrate self-control will improve Outcome: Progressing

## 2023-12-02 NOTE — BH IP Treatment Plan (Signed)
 Interdisciplinary Treatment and Diagnostic Plan Update  12/02/2023 Time of Session: 2:00PM NIKKO QUAST MRN: [id number]  Principal Diagnosis: Polysubstance abuse [facility])  Secondary Diagnoses: Principal Problem:   Polysubstance abuse (HCC) Active Problems:   Substance induced mood disorder (HCC)   Current Medications:  Current Facility-Administered Medications  Medication Dose Route Frequency Provider Last Rate Last Admin   acetaminophen  (TYLENOL ) tablet 650 mg  650 mg Oral Q6H PRN McLauchlin, Angela, NP       alum & mag hydroxide-simeth (MAALOX/MYLANTA) 200-200-20 MG/5ML suspension 30 mL  30 mL Oral Q4H PRN McLauchlin, Angela, NP       haloperidol  (HALDOL ) tablet 5 mg  5 mg Oral TID PRN McLauchlin, Angela, NP   5 mg at 12/02/23 0132   And   diphenhydrAMINE  (BENADRYL ) capsule 50 mg  50 mg Oral TID PRN McLauchlin, Angela, NP   50 mg at 12/02/23 0132   haloperidol  lactate (HALDOL ) injection 5 mg  5 mg Intramuscular TID PRN McLauchlin, Angela, NP   5 mg at 11/29/23 1407   And   diphenhydrAMINE  (BENADRYL ) injection 50 mg  50 mg Intramuscular TID PRN McLauchlin, Jon, NP       And   LORazepam  (ATIVAN ) injection 2 mg  2 mg Intramuscular TID PRN McLauchlin, Jon, NP       haloperidol  lactate (HALDOL ) injection 10 mg  10 mg Intramuscular TID PRN McLauchlin, Jon, NP       And   diphenhydrAMINE  (BENADRYL ) injection 50 mg  50 mg Intramuscular TID PRN McLauchlin, Angela, NP   50 mg at 11/29/23 1407   And   LORazepam  (ATIVAN ) injection 2 mg  2 mg Intramuscular TID PRN McLauchlin, Angela, NP       divalproex (DEPAKOTE ER) 24 hr tablet 500 mg  500 mg Oral QHS Jadapalle, Sree, MD   500 mg at 12/01/23 2059   hydrOXYzine (ATARAX) tablet 25 mg  25 mg Oral TID PRN McLauchlin, Angela, NP   25 mg at 12/01/23 2258   influenza vac split trivalent PF (FLUZONE) injection 0.5 mL  0.5 mL Intramuscular Tomorrow-1000 Jalandhara, Vinitkumar, MD       magnesium  hydroxide (MILK OF MAGNESIA) suspension 30 mL   30 mL Oral Daily PRN McLauchlin, Angela, NP       multivitamin with minerals tablet 1 tablet  1 tablet Oral Daily Jadapalle, Sree, MD   1 tablet at 12/02/23 9162   nicotine  (NICODERM CQ  - dosed in mg/24 hours) patch 21 mg  21 mg Transdermal Daily Madaram, Kondal R, MD   21 mg at 12/02/23 9162   nicotine  polacrilex (NICORETTE) gum 2 mg  2 mg Oral PRN Jadapalle, Sree, MD       OLANZapine  (ZYPREXA ) tablet 5 mg  5 mg Oral QHS Madaram, Kondal R, MD   5 mg at 12/01/23 2059   traZODone  (DESYREL ) tablet 50 mg  50 mg Oral QHS PRN McLauchlin, Angela, NP   50 mg at 12/01/23 2059   PTA Medications: Medications Prior to Admission  Medication Sig Dispense Refill Last Dose/Taking   amLODipine  (NORVASC ) 2.5 MG tablet Take 1 tablet (2.5 mg total) by mouth daily. (Patient not taking: Reported on 04/12/2019) 30 tablet 0    doxycycline  (VIBRAMYCIN ) 100 MG capsule Take 1 capsule (100 mg total) by mouth 2 (two) times daily. (Patient not taking: Reported on 04/12/2019) 28 capsule 0    FLUoxetine  (PROZAC ) 20 MG capsule Take 1 capsule (20 mg total) by mouth daily. (Patient not taking: Reported on 10/21/2018) 30  capsule 0    haloperidol  (HALDOL ) 0.5 MG tablet Take 1 tablet (0.5 mg total) by mouth 3 (three) times daily. (Patient not taking: Reported on 10/06/2014) 90 tablet 0    HYDROcodone -acetaminophen  (NORCO/VICODIN) 5-325 MG per tablet Take 1 tablet by mouth every 6 (six) hours as needed for moderate pain or severe pain. (Patient not taking: Reported on 10/21/2018) 8 tablet 0    hydrocortisone  (ANUSOL -HC) 2.5 % rectal cream Place 1 application rectally 2 (two) times daily. 30 g 0    ibuprofen  (ADVIL ,MOTRIN ) 800 MG tablet Take 1 tablet (800 mg total) by mouth 3 (three) times daily. (Patient not taking: Reported on 10/21/2018) 21 tablet 0    methocarbamol  (ROBAXIN ) 500 MG tablet Take 2 tablets (1,000 mg total) by mouth 2 (two) times daily. (Patient not taking: Reported on 04/12/2019) 20 tablet 0    nicotine  (NICOTROL ) 10 MG  inhaler Inhale 1 cartridge (1 continuous puffing total) into the lungs as needed for smoking cessation. (Patient not taking: Reported on 10/06/2014) 42 each 0    traZODone  (DESYREL ) 150 MG tablet Take 1 tablet (150 mg total) by mouth at bedtime as needed for sleep. (Patient not taking: Reported on 10/06/2014) 30 tablet 0     Patient Stressors: Financial difficulties   Substance abuse    Patient Strengths: Average or above average intelligence  Capable of independent living   Treatment Modalities: Medication Management, Group therapy, Case management,  1 to 1 session with clinician, Psychoeducation, Recreational therapy.   Physician Treatment Plan for Primary Diagnosis: Polysubstance abuse (HCC) Long Term Goal(s): Improvement in symptoms so as ready for discharge   Short Term Goals: Ability to identify changes in lifestyle to reduce recurrence of condition will improve Ability to identify and develop effective coping behaviors will improve Ability to disclose and discuss suicidal ideas Ability to identify triggers associated with substance abuse/mental health issues will improve  Medication Management: Evaluate patient's response, side effects, and tolerance of medication regimen.  Therapeutic Interventions: 1 to 1 sessions, Unit Group sessions and Medication administration.  Evaluation of Outcomes: Progressing  Physician Treatment Plan for Secondary Diagnosis: Principal Problem:   Polysubstance abuse (HCC) Active Problems:   Substance induced mood disorder (HCC)  Long Term Goal(s): Improvement in symptoms so as ready for discharge   Short Term Goals: Ability to identify changes in lifestyle to reduce recurrence of condition will improve Ability to identify and develop effective coping behaviors will improve Ability to disclose and discuss suicidal ideas Ability to identify triggers associated with substance abuse/mental health issues will improve     Medication Management:  Evaluate patient's response, side effects, and tolerance of medication regimen.  Therapeutic Interventions: 1 to 1 sessions, Unit Group sessions and Medication administration.  Evaluation of Outcomes: Progressing   RN Treatment Plan for Primary Diagnosis: Polysubstance abuse (HCC) Long Term Goal(s): Knowledge of disease and therapeutic regimen to maintain health will improve  Short Term Goals: Ability to demonstrate self-control, Ability to participate in decision making will improve, Ability to verbalize feelings will improve, Ability to disclose and discuss suicidal ideas, Ability to identify and develop effective coping behaviors will improve, and Compliance with prescribed medications will improve  Medication Management: RN will administer medications as ordered by provider, will assess and evaluate patient's response and provide education to patient for prescribed medication. RN will report any adverse and/or side effects to prescribing provider.  Therapeutic Interventions: 1 on 1 counseling sessions, Psychoeducation, Medication administration, Evaluate responses to treatment, Monitor vital signs and CBGs as  ordered, Perform/monitor CIWA, COWS, AIMS and Fall Risk screenings as ordered, Perform wound care treatments as ordered.  Evaluation of Outcomes: Progressing   LCSW Treatment Plan for Primary Diagnosis: Polysubstance abuse (HCC) Long Term Goal(s): Safe transition to appropriate next level of care at discharge, Engage patient in therapeutic group addressing interpersonal concerns.  Short Term Goals: Engage patient in aftercare planning with referrals and resources, Increase social support, Increase ability to appropriately verbalize feelings, Increase emotional regulation, Facilitate acceptance of mental health diagnosis and concerns, and Increase skills for wellness and recovery  Therapeutic Interventions: Assess for all discharge needs, 1 to 1 time with Social worker, Explore  available resources and support systems, Assess for adequacy in community support network, Educate family and significant other(s) on suicide prevention, Complete Psychosocial Assessment, Interpersonal group therapy.  Evaluation of Outcomes: Progressing   Progress in Treatment: Attending groups: No. Participating in groups: No. Taking medication as prescribed: Yes. Toleration medication: Yes. Family/Significant other contact made: Yes, individual(s) contacted:  SPE completed with the patient's father.   Patient understands diagnosis: Yes. Discussing patient identified problems/goals with staff: No. Medical problems stabilized or resolved: Yes. Denies suicidal/homicidal ideation: Yes. Issues/concerns per patient self-inventory: No. Other: none  New problem(s) identified: No, Describe:  none identified. Update 12/02/2023: No changes at this time.    New Short Term/Long Term Goal(s): medication management for mood stabilization; elimination of SI thoughts; development of comprehensive mental wellness/sobriety plan.  Update 12/02/2023: No changes at this time.    Patient Goals:  Pt declined to participate in treatment team meeting. Update 12/02/2023: No changes at this time.    Discharge Plan or Barriers: CSW will assist with development of an appropriate aftercare/discharge plan. Update 12/02/2023: Patient reports plans to go to her mothers home at discharge. Father has agreed to this plan.  Appointments are in.    Reason for Continuation of Hospitalization: Anxiety Depression Medication stabilization Suicidal ideation   Estimated Length of Stay: 1-7 days  Update 12/02/2023: TBD  Last 3 [place] Suicide Severity Risk Score: Flowsheet Row Admission (Current) from 11/27/2023 in [facility] ED from 11/26/2023 in [facility] Emergency Department at [facility] ED from 07/19/2020 in [facility] Emergency Department at [facility]  C-SSRS RISK CATEGORY Error:  Question 2 not populated High Risk No Risk    Last PHQ 2/9 Scores:     No data to display          Scribe for Treatment Team: Sherryle JINNY Margo, KEN 12/02/2023 3:46 PM

## 2023-12-02 NOTE — Progress Notes (Signed)
  [facility] Adult Case Management Discharge Plan :  Will you be returning to the same living situation after discharge:  No.  Patient reports that she will stay with  her parents.  At discharge, do you have transportation home?: Yes,  pt reports that her car is on campus.  Do you have the ability to pay for your medications: Yes,  [place] MEDICAID PREPAID HEALTH PLAN / [place] MEDICAID HEALTHY BLUE  Release of information consent forms completed and in the chart;  Patient's signature needed at discharge.  Patient to Follow up at:  Follow-up Information     [facility]  Follow up.   Why: Appointment is scheduled for Friday 12/13/2023 at 11AM Contact information: [address] [place] [address] [phone]                 Next level of care provider has access to [facility] Link:yes  Safety Planning and Suicide Prevention discussed: Yes,  SPE completed with the patient and patient's parents.      Has patient been referred to the Quitline?: Patient refused referral for treatment  Patient has been referred for addiction treatment: Yes, the patient will follow up with an outpatient provider for substance use disorder. Therapist: appointment made  Sherryle JINNY Margo, LCSW 12/02/2023, 4:04 PM

## 2023-12-12 ENCOUNTER — Ambulatory Visit

## 2023-12-12 DIAGNOSIS — Z113 Encounter for screening for infections with a predominantly sexual mode of transmission: Secondary | ICD-10-CM

## 2023-12-12 DIAGNOSIS — B372 Candidiasis of skin and nail: Secondary | ICD-10-CM

## 2023-12-12 DIAGNOSIS — A5901 Trichomonal vulvovaginitis: Secondary | ICD-10-CM

## 2023-12-12 LAB — WET PREP FOR TRICH, YEAST, CLUE
Clue Cell Exam: NEGATIVE
Trichomonas Exam: POSITIVE — AB
Yeast Exam: NEGATIVE

## 2023-12-12 MED ORDER — CLOTRIMAZOLE-BETAMETHASONE 1-0.05 % EX CREA: 1.0000 | TOPICAL_CREAM | Freq: Every day | CUTANEOUS | 2 refills | Status: AC

## 2023-12-12 MED ORDER — METRONIDAZOLE 500 MG PO TABS: 500.0000 mg | ORAL_TABLET | Freq: Two times a day (BID) | ORAL | Status: AC
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
## 2023-12-12 NOTE — Progress Notes (Signed)
 Pt is here for STD screening. Wet prep results reviewed with patient. Pt was dispensed metronidazole  500 mg tablets 2x/day for 7 days by provider. Contact card, brochure and condoms given. Kwadwo Armondo Cech,RN.

## 2023-12-15 NOTE — Progress Notes (Signed)
Chart reviewed by Pharmacist  Suzanne Walker PharmD, Contract Pharmacist at [facility]  

## 2023-12-16 DIAGNOSIS — F3113 Bipolar disorder, current episode manic without psychotic features, severe: Secondary | ICD-10-CM | POA: Diagnosis not present

## 2023-12-16 LAB — GONOCOCCUS CULTURE

## 2023-12-17 DIAGNOSIS — F3113 Bipolar disorder, current episode manic without psychotic features, severe: Secondary | ICD-10-CM | POA: Diagnosis not present

## 2023-12-19 DIAGNOSIS — F3113 Bipolar disorder, current episode manic without psychotic features, severe: Secondary | ICD-10-CM | POA: Diagnosis not present
